# Patient Record
Sex: Male | Born: 1940 | Race: White | Hispanic: No | Marital: Married | Smoking: Never smoker
Health system: Southern US, Community
[De-identification: ages and names within clinical notes are randomized; demographics above are authoritative.]

## PROBLEM LIST (undated history)

## (undated) DIAGNOSIS — E7800 Pure hypercholesterolemia, unspecified: Secondary | ICD-10-CM

## (undated) DIAGNOSIS — J939 Pneumothorax, unspecified: Secondary | ICD-10-CM

## (undated) DIAGNOSIS — Z9889 Other specified postprocedural states: Secondary | ICD-10-CM

## (undated) DIAGNOSIS — I10 Essential (primary) hypertension: Secondary | ICD-10-CM

## (undated) DIAGNOSIS — K219 Gastro-esophageal reflux disease without esophagitis: Secondary | ICD-10-CM

## (undated) HISTORY — PX: COLONOSCOPY: SHX174

## (undated) HISTORY — PX: CORONARY ANGIOPLASTY WITH STENT PLACEMENT: SHX49

## (undated) HISTORY — PX: ESOPHAGOGASTRODUODENOSCOPY (EGD) WITH ESOPHAGEAL DILATION: SHX5812

## (undated) HISTORY — PX: CHEST TUBE INSERTION: SHX231

---

## 1999-08-27 ENCOUNTER — Ambulatory Visit (HOSPITAL_COMMUNITY): Admission: RE | Admit: 1999-08-27 | Discharge: 1999-08-27 | Payer: Self-pay | Admitting: *Deleted

## 2001-12-06 ENCOUNTER — Ambulatory Visit (HOSPITAL_COMMUNITY): Admission: RE | Admit: 2001-12-06 | Discharge: 2001-12-06 | Payer: Self-pay | Admitting: Family Medicine

## 2001-12-06 ENCOUNTER — Encounter: Payer: Self-pay | Admitting: Family Medicine

## 2001-12-08 ENCOUNTER — Ambulatory Visit (HOSPITAL_COMMUNITY): Admission: RE | Admit: 2001-12-08 | Discharge: 2001-12-08 | Payer: Self-pay | Admitting: Internal Medicine

## 2002-06-01 ENCOUNTER — Ambulatory Visit (HOSPITAL_COMMUNITY): Admission: RE | Admit: 2002-06-01 | Discharge: 2002-06-01 | Payer: Self-pay | Admitting: Internal Medicine

## 2002-09-07 ENCOUNTER — Encounter: Payer: Self-pay | Admitting: Family Medicine

## 2002-09-07 ENCOUNTER — Ambulatory Visit (HOSPITAL_COMMUNITY): Admission: RE | Admit: 2002-09-07 | Discharge: 2002-09-07 | Payer: Self-pay | Admitting: Family Medicine

## 2002-10-08 ENCOUNTER — Encounter (HOSPITAL_COMMUNITY): Admission: RE | Admit: 2002-10-08 | Discharge: 2002-11-07 | Payer: Self-pay | Admitting: *Deleted

## 2002-10-08 ENCOUNTER — Encounter: Payer: Self-pay | Admitting: *Deleted

## 2011-08-25 DIAGNOSIS — H04129 Dry eye syndrome of unspecified lacrimal gland: Secondary | ICD-10-CM | POA: Diagnosis not present

## 2011-08-25 DIAGNOSIS — H26499 Other secondary cataract, unspecified eye: Secondary | ICD-10-CM | POA: Diagnosis not present

## 2011-08-25 DIAGNOSIS — Z961 Presence of intraocular lens: Secondary | ICD-10-CM | POA: Diagnosis not present

## 2011-08-25 DIAGNOSIS — H538 Other visual disturbances: Secondary | ICD-10-CM | POA: Diagnosis not present

## 2011-10-26 DIAGNOSIS — Z79899 Other long term (current) drug therapy: Secondary | ICD-10-CM | POA: Diagnosis not present

## 2011-10-26 DIAGNOSIS — L2089 Other atopic dermatitis: Secondary | ICD-10-CM | POA: Diagnosis not present

## 2011-12-28 DIAGNOSIS — I10 Essential (primary) hypertension: Secondary | ICD-10-CM | POA: Diagnosis not present

## 2012-02-01 DIAGNOSIS — L2089 Other atopic dermatitis: Secondary | ICD-10-CM | POA: Diagnosis not present

## 2012-02-01 DIAGNOSIS — L570 Actinic keratosis: Secondary | ICD-10-CM | POA: Diagnosis not present

## 2012-02-01 DIAGNOSIS — Z79899 Other long term (current) drug therapy: Secondary | ICD-10-CM | POA: Diagnosis not present

## 2012-06-19 DIAGNOSIS — E785 Hyperlipidemia, unspecified: Secondary | ICD-10-CM | POA: Diagnosis not present

## 2012-06-19 DIAGNOSIS — Z79899 Other long term (current) drug therapy: Secondary | ICD-10-CM | POA: Diagnosis not present

## 2012-06-19 DIAGNOSIS — I10 Essential (primary) hypertension: Secondary | ICD-10-CM | POA: Diagnosis not present

## 2012-06-27 ENCOUNTER — Other Ambulatory Visit: Payer: Self-pay

## 2012-06-27 DIAGNOSIS — I10 Essential (primary) hypertension: Secondary | ICD-10-CM | POA: Diagnosis not present

## 2012-06-27 DIAGNOSIS — E785 Hyperlipidemia, unspecified: Secondary | ICD-10-CM | POA: Diagnosis not present

## 2012-06-27 DIAGNOSIS — Z139 Encounter for screening, unspecified: Secondary | ICD-10-CM

## 2012-06-27 DIAGNOSIS — Z23 Encounter for immunization: Secondary | ICD-10-CM | POA: Diagnosis not present

## 2012-07-04 ENCOUNTER — Telehealth: Payer: Self-pay

## 2012-07-04 NOTE — Telephone Encounter (Signed)
Pt came by the office on 06/27/2012 to schedule colonoscopy, ( His last one was 06/01/2002 by [facility]).  Dr. Ouida Sills is PCP now and I faxed over request for med list and Jewel sent it to me.   Gastroenterology Pre-Procedure Form  Request Date: 06/27/2012     Requesting Physician: Dr. Ouida Sills     PATIENT INFORMATION:  Randy Maddox is a 71 y.o., male (DOB=1940-12-12).  PROCEDURE: Procedure(s) requested: colonoscopy Procedure Reason: screening for colon cancer  PATIENT REVIEW QUESTIONS: The patient reports the following:   1. Diabetes Melitis: no 2. Joint replacements in the past 12 months: no 3. Major health problems in the past 3 months: no 4. Has an artificial valve or MVP:no 5. Has been advised in past to take antibiotics in advance of a procedure like teeth cleaning: no}    MEDICATIONS & ALLERGIES:    Patient reports the following regarding taking any blood thinners:   Plavix? no Aspirin?yes  Coumadin?  no  Patient confirms/reports the following medications:  Current Outpatient Prescriptions  Medication Sig Dispense Refill  . aspirin 81 MG tablet Take 81 mg by mouth daily.      . benazepril (LOTENSIN) 20 MG tablet Take 20 mg by mouth daily.      Marland Kitchen omeprazole (PRILOSEC) 40 MG capsule Take 40 mg by mouth daily.      . simvastatin (ZOCOR) 40 MG tablet Take 40 mg by mouth every evening.        Patient confirms/reports the following allergies:  Allergies  Allergen Reactions  . Darvocet (Propoxyphene-Acetaminophen)     vomiting  . Aleve (Naproxen Sodium) Diarrhea  . Vioxx (Rofecoxib) Other (See Comments)    HEADACHE    Patient is appropriate to schedule for requested procedure(s): yes  AUTHORIZATION INFORMATION Primary Insurance:  ID #:  Group #:  Pre-Cert / Auth required:  Pre-Cert / Auth #:   Secondary Insurance:   ID #:   Group #:  Pre-Cert / Auth required:  Pre-Cert / Auth #:   No orders of the defined types were placed in this encounter.    SCHEDULE  INFORMATION: Procedure has been scheduled as follows:  Date: 08/17/2012      Time: 10:30 AM  Location: [facility] Short Stay  This Gastroenterology Pre-Precedure Form is being routed to the following provider(s) for review: R. Roetta Sessions, MD

## 2012-07-04 NOTE — Telephone Encounter (Signed)
OK to schedule

## 2012-07-05 MED ORDER — PEG-KCL-NACL-NASULF-NA ASC-C 100 G PO SOLR: 1.0000 | ORAL | Status: DC

## 2012-07-05 NOTE — Telephone Encounter (Signed)
Rx sent to [facility] and Instructions mailed to pt.

## 2012-07-13 DIAGNOSIS — Z0000 Encounter for general adult medical examination without abnormal findings: Secondary | ICD-10-CM | POA: Diagnosis not present

## 2012-07-25 ENCOUNTER — Telehealth: Payer: Self-pay

## 2012-07-25 NOTE — Telephone Encounter (Signed)
Called pt to update triage. He has not had any new problems and no change in medications. He is scheduled for 08/17/2012.

## 2012-07-26 ENCOUNTER — Encounter (HOSPITAL_COMMUNITY): Payer: Self-pay | Admitting: Pharmacy Technician

## 2012-07-26 NOTE — Telephone Encounter (Signed)
OK to schedule

## 2012-08-11 ENCOUNTER — Telehealth: Payer: Self-pay

## 2012-08-11 NOTE — Telephone Encounter (Signed)
I called [employer] at [phone] and the recorder said that no PA's required for any of their plans.

## 2012-08-15 ENCOUNTER — Telehealth: Payer: Self-pay

## 2012-08-15 NOTE — Telephone Encounter (Signed)
Pt said he does not have any renal problems. He came by and picked up the prep and instructions.

## 2012-08-15 NOTE — Telephone Encounter (Signed)
Ok to use prepopik.

## 2012-08-15 NOTE — Telephone Encounter (Signed)
Pt called to check on [employer]. ( I had sent Rx to [facility] but he was in with his wife when she scheduled and she got a free sample and he requested one and so Soledad Gerlach and I told him we would try to get one before his procedure on 08/17/2012.) However, Charlie with [employer] has had a death in his family. Soledad Gerlach had e-mailed him and asked him to call and we have not heard from him. He is upset because we do not have one and said it was going to be a financial bind on him. He will have to put on a credit card. I told him I have a $20.00 coupon and he said he did not care anything about that , that will not help him now. I explained that we do not get lots of preps anyway, and I was sorry, offered to reschedule and he refused. Said if he doesn't get procedure this time, he will never do again. He said he had a good report last time and he felt like not doing it this time anyway. Then he said he was allergic to GO LYTELY, caused sick stomach and last time he got sick and they called in 2 pills and Mag Citriate for him. ( He said Dr. Jena Gauss told him that he was allergic to the same, GOLYTELY).   Please advise if this patient should have something other than the [employer].

## 2012-08-17 ENCOUNTER — Encounter (HOSPITAL_COMMUNITY): Payer: Self-pay | Admitting: *Deleted

## 2012-08-17 ENCOUNTER — Ambulatory Visit (HOSPITAL_COMMUNITY)
Admission: RE | Admit: 2012-08-17 | Discharge: 2012-08-17 | Disposition: A | Discharge status: Home / Self Care | Payer: Medicare Other | Source: Ambulatory Visit | Attending: Internal Medicine | Admitting: Internal Medicine

## 2012-08-17 ENCOUNTER — Encounter (HOSPITAL_COMMUNITY)
Admission: RE | Disposition: A | Discharge status: Home / Self Care | Payer: Self-pay | Source: Ambulatory Visit | Attending: Internal Medicine

## 2012-08-17 DIAGNOSIS — K5730 Diverticulosis of large intestine without perforation or abscess without bleeding: Secondary | ICD-10-CM | POA: Insufficient documentation

## 2012-08-17 DIAGNOSIS — Z139 Encounter for screening, unspecified: Secondary | ICD-10-CM

## 2012-08-17 DIAGNOSIS — Z1211 Encounter for screening for malignant neoplasm of colon: Secondary | ICD-10-CM | POA: Insufficient documentation

## 2012-08-17 DIAGNOSIS — I10 Essential (primary) hypertension: Secondary | ICD-10-CM | POA: Diagnosis not present

## 2012-08-17 HISTORY — PX: COLONOSCOPY: SHX5424

## 2012-08-17 HISTORY — DX: Gastro-esophageal reflux disease without esophagitis: K21.9

## 2012-08-17 HISTORY — DX: Essential (primary) hypertension: I10

## 2012-08-17 HISTORY — DX: Pure hypercholesterolemia, unspecified: E78.00

## 2012-08-17 SURGERY — COLONOSCOPY
Anesthesia: Moderate Sedation

## 2012-08-17 MED ORDER — ONDANSETRON HCL 4 MG/2ML IJ SOLN
INTRAMUSCULAR | Status: AC
  Filled 2012-08-17: qty 2

## 2012-08-17 MED ORDER — MIDAZOLAM HCL 5 MG/5ML IJ SOLN
INTRAMUSCULAR | Status: AC
  Filled 2012-08-17: qty 10

## 2012-08-17 MED ORDER — MEPERIDINE HCL 100 MG/ML IJ SOLN
INTRAMUSCULAR | Status: DC | PRN
  Administered 2012-08-17: 50 mg via INTRAVENOUS
  Administered 2012-08-17 (×2): 25 mg via INTRAVENOUS

## 2012-08-17 MED ORDER — SIMETHICONE 40 MG/0.6ML PO SUSP
ORAL | Status: DC | PRN
  Administered 2012-08-17: 11:00:00

## 2012-08-17 MED ORDER — MIDAZOLAM HCL 5 MG/5ML IJ SOLN
INTRAMUSCULAR | Status: DC | PRN
  Administered 2012-08-17: 2 mg via INTRAVENOUS
  Administered 2012-08-17: 1 mg via INTRAVENOUS
  Administered 2012-08-17: 2 mg via INTRAVENOUS

## 2012-08-17 MED ORDER — METOCLOPRAMIDE HCL 5 MG/ML IJ SOLN
INTRAMUSCULAR | Status: AC
  Filled 2012-08-17: qty 2

## 2012-08-17 MED ORDER — ONDANSETRON HCL 4 MG/2ML IJ SOLN
4.0000 mg | Freq: Once | INTRAMUSCULAR | Status: AC
  Administered 2012-08-17: 4 mg via INTRAVENOUS

## 2012-08-17 MED ORDER — METOCLOPRAMIDE HCL 5 MG/ML IJ SOLN
10.0000 mg | Freq: Once | INTRAMUSCULAR | Status: AC
  Administered 2012-08-17: 10 mg via INTRAVENOUS

## 2012-08-17 MED ORDER — MEPERIDINE HCL 100 MG/ML IJ SOLN
INTRAMUSCULAR | Status: DC
Start: 2012-08-17 — End: 2012-08-17
  Filled 2012-08-17: qty 2

## 2012-08-17 MED ORDER — METOCLOPRAMIDE HCL 5 MG/5ML PO SOLN: 10.0000 mg | Freq: Once | ORAL | Status: DC

## 2012-08-17 MED ORDER — SODIUM CHLORIDE 0.45 % IV SOLN
INTRAVENOUS | Status: DC
  Administered 2012-08-17: 10:00:00 via INTRAVENOUS

## 2012-08-17 NOTE — H&P (Signed)
Primary Care Physician:  Carylon Perches, MD Primary Gastroenterologist:  Dr. Jena Gauss  Pre-Procedure History & Physical: HPI:  Randy Maddox is a 72 y.o. male is here for a screening colonoscopy. No bowel symptoms. No family history of colon cancer or colon polyps. .  Negative colonoscopy 10 years ago.  Past Medical History  Diagnosis Date  . Hypertension   . Hypercholesteremia   . GERD (gastroesophageal reflux disease)     Past Surgical History  Procedure Date  . Esophagogastroduodenoscopy (egd) with esophageal dilation   . Colonoscopy   . Chest tube insertion     38 years ago due to spontaneous pneumothorax    Prior to Admission medications   Medication Sig Start Date End Date Taking? Authorizing Provider  benazepril (LOTENSIN) 20 MG tablet Take 20 mg by mouth daily.   Yes Historical Provider, MD  Multiple Vitamin (MULTIVITAMIN WITH MINERALS) TABS Take 1 tablet by mouth daily.   Yes Historical Provider, MD  omeprazole (PRILOSEC) 40 MG capsule Take 40 mg by mouth daily.   Yes Historical Provider, MD  peg 3350 powder (MOVIPREP) 100 G SOLR Take 1 kit (100 g total) by mouth as directed. 07/05/12  Yes Corbin Ade, MD  aspirin 81 MG tablet Take 81 mg by mouth daily.    Historical Provider, MD  simvastatin (ZOCOR) 40 MG tablet Take 40 mg by mouth every evening.    Historical Provider, MD    Allergies as of 06/27/2012  . (Not on File)    Family History  Problem Relation Age of Onset  . Colon cancer Neg Hx     History   Social History  . Marital Status: Married    Spouse Name: N/A    Number of Children: N/A  . Years of Education: N/A   Occupational History  . Not on file.   Social History Main Topics  . Smoking status: Never Smoker   . Smokeless tobacco: Not on file  . Alcohol Use: No  . Drug Use: No  . Sexually Active:    Other Topics Concern  . Not on file   Social History Narrative  . No narrative on file    Review of Systems: See HPI, otherwise negative  ROS  Physical Exam: BP 138/91  Pulse 85  Temp 97.8 F (36.6 C) (Oral)  Resp 24  Ht 5\' 11"  (1.803 m)  Wt 165 lb (74.844 kg)  BMI 23.01 kg/m2  SpO2 98% General:   Alert,  Well-developed, well-nourished, pleasant and cooperative in NAD Head:  Normocephalic and atraumatic. Eyes:  Sclera clear, no icterus.   Conjunctiva pink. Ears:  Normal auditory acuity. Nose:  No deformity, discharge,  or lesions. Mouth:  No deformity or lesions, dentition normal. Neck:  Supple; no masses or thyromegaly. Lungs:  Clear throughout to auscultation.   No wheezes, crackles, or rhonchi. No acute distress. Heart:  Regular rate and rhythm; no murmurs, clicks, rubs,  or gallops. Abdomen:  Soft, nontender and nondistended. No masses, hepatosplenomegaly or hernias noted. Normal bowel sounds, without guarding, and without rebound.   Msk:  Symmetrical without gross deformities. Normal posture. Pulses:  Normal pulses noted. Extremities:  Without clubbing or edema. Neurologic:  Alert and  oriented x4;  grossly normal neurologically. Skin:  Intact without significant lesions or rashes. Cervical Nodes:  No significant cervical adenopathy. Psych:  Alert and cooperative. Normal mood and affect.  Impression/Plan: Randy Maddox is now here to undergo a screening colonoscopy.  Average risk screening examination.  Risks,  benefits, limitations, imponderables and alternatives regarding colonoscopy have been reviewed with the patient. Questions have been answered. All parties agreeable.

## 2012-08-17 NOTE — OR Nursing (Addendum)
Pt with complaints of nausea.  Verbal order from Dr Jena Gauss obtained for zofran 4mg  IV now for nausea. Repeated back and affirmed.   Pt given the zofran.  When attempting to sit on side of bed became weak & once again complained of nausea.  Reclined pt back in bed; applied cold cloth to face & back.  Blood pressure 109/56.  Fluids continuing to be infused.  Pt has received 650cc 0.45 NS to this point.                Pt up to chair.  Pt's wife stated pt vomiting.  Pt with approximately 100cc liquid emesis in basin.  Dr. Jena Gauss made aware.  Verbal order for zofran 4mg  IV now by Dr Jena Gauss for nausea.    After getting dressed, pt with vomiting. Approximately 50cc.  Dr Jena Gauss made aware.  Verbal order for 10mg  IV Reglan one dose now.  Also verbal order to restart IV. Read back and affirmed order.

## 2012-08-17 NOTE — Op Note (Signed)
[facility] [address] [place], [address]   COLONOSCOPY PROCEDURE REPORT  PATIENT: Randy Maddox, Randy Maddox  MR#:         [id number] BIRTHDATE: 09/02/1940 , 71  yrs. old GENDER: Male ENDOSCOPIST: R.  Roetta Sessions, MD FACP FACG REFERRED BY:  Carylon Perches, M.D. PROCEDURE DATE:  08/17/2012 PROCEDURE:     Screening colonoscopy  INDICATIONS: Average risk screening examination  INFORMED CONSENT:  The risks, benefits, alternatives and imponderables including but not limited to bleeding, perforation as well as the possibility of a missed lesion have been reviewed.  The potential for biopsy, lesion removal, etc. have also been discussed.  Questions have been answered.  All parties agreeable. Please see the history and physical in the medical record for more information.  MEDICATIONS: Versed 5 mg IV and Demerol milligrams IV in divided doses.  DESCRIPTION OF PROCEDURE:  After a digital rectal exam was performed, the Pentax Colonoscope [id number]  colonoscope was advanced from the anus through the rectum and colon to the area of the cecum, ileocecal valve and appendiceal orifice.  The cecum was deeply intubated.  These structures were well-seen and photographed for the record.  From the level of the cecum and ileocecal valve, the scope was slowly and cautiously withdrawn.  The mucosal surfaces were carefully surveyed utilizing scope tip deflection to facilitate fold flattening as needed.  The scope was pulled down into the rectum where a thorough examination including retroflexion was performed.    FINDINGS:  Adequate preparation. Normal rectum. Scattered left-sided diverticula; the remainder of the colonic mucosa appeared normal.  THERAPEUTIC / DIAGNOSTIC MANEUVERS PERFORMED:  None  COMPLICATIONS: None  CECAL WITHDRAWAL TIME:  8 minutes  IMPRESSION:  Colonic diverticulosis  RECOMMENDATIONS: Consider one more screening colonoscopy in 10 years if overall health  permits   _______________________________ eSigned:  R. Roetta Sessions, MD FACP [facility] 08/17/2012 11:33 AM   CC:

## 2012-08-21 ENCOUNTER — Telehealth: Payer: Self-pay | Admitting: Internal Medicine

## 2012-08-21 NOTE — Telephone Encounter (Signed)
Spoke with pt- he has not had a bm since his procedure. He stated he feels like he needs to go and he goes to the bathroom and has nothing but gas. Pt is not having any pain, no N/V now, no dizziness, no fever, no more problems with his blood pressure. He has not tried to take anything yet. He does have a bottle of colace 100mg  at home and would like to try it tonight. Advised pt to try one tonight and drink plenty of fluids and to call me back if he continues to have problems. Also advised pt- he should get some otc miralax to have on hand for when he did become constipated. Pt verbalized understanding and agreed with plan. He said if he didn't have a bm tomorrow he was going to call back.

## 2012-08-21 NOTE — Telephone Encounter (Signed)
Pt had tcs on January 9 and still has not had a BM and wanted to know was this normal. Also, he mentioned that while at hospital his BP dropped twice. Saturday he said that he was so nauseated he had to stay in bed. He said that he was able to pass gas and his urine was fine, but had concerns about not having a BM and wondered if he should take a stool softener. Please advise and call patient at [phone]

## 2012-08-22 ENCOUNTER — Encounter (HOSPITAL_COMMUNITY): Payer: Self-pay | Admitting: Internal Medicine

## 2012-08-22 NOTE — Telephone Encounter (Signed)
Agree 

## 2012-09-14 DIAGNOSIS — Q828 Other specified congenital malformations of skin: Secondary | ICD-10-CM | POA: Diagnosis not present

## 2012-09-19 DIAGNOSIS — C44320 Squamous cell carcinoma of skin of unspecified parts of face: Secondary | ICD-10-CM | POA: Diagnosis not present

## 2012-09-19 DIAGNOSIS — L259 Unspecified contact dermatitis, unspecified cause: Secondary | ICD-10-CM | POA: Diagnosis not present

## 2012-10-06 DIAGNOSIS — L2089 Other atopic dermatitis: Secondary | ICD-10-CM | POA: Diagnosis not present

## 2012-10-06 DIAGNOSIS — C44320 Squamous cell carcinoma of skin of unspecified parts of face: Secondary | ICD-10-CM | POA: Diagnosis not present

## 2012-10-26 DIAGNOSIS — C44320 Squamous cell carcinoma of skin of unspecified parts of face: Secondary | ICD-10-CM | POA: Diagnosis not present

## 2012-11-02 DIAGNOSIS — Z4802 Encounter for removal of sutures: Secondary | ICD-10-CM | POA: Diagnosis not present

## 2012-12-14 DIAGNOSIS — Z483 Aftercare following surgery for neoplasm: Secondary | ICD-10-CM | POA: Diagnosis not present

## 2012-12-25 DIAGNOSIS — I10 Essential (primary) hypertension: Secondary | ICD-10-CM | POA: Diagnosis not present

## 2013-04-03 DIAGNOSIS — L2089 Other atopic dermatitis: Secondary | ICD-10-CM | POA: Diagnosis not present

## 2013-04-03 DIAGNOSIS — Z85828 Personal history of other malignant neoplasm of skin: Secondary | ICD-10-CM | POA: Diagnosis not present

## 2013-06-12 DIAGNOSIS — H2640 Unspecified secondary cataract: Secondary | ICD-10-CM | POA: Diagnosis not present

## 2013-06-12 DIAGNOSIS — H5000 Unspecified esotropia: Secondary | ICD-10-CM | POA: Diagnosis not present

## 2013-06-12 DIAGNOSIS — H179 Unspecified corneal scar and opacity: Secondary | ICD-10-CM | POA: Diagnosis not present

## 2013-06-12 DIAGNOSIS — H532 Diplopia: Secondary | ICD-10-CM | POA: Diagnosis not present

## 2013-06-22 DIAGNOSIS — I10 Essential (primary) hypertension: Secondary | ICD-10-CM | POA: Diagnosis not present

## 2013-06-22 DIAGNOSIS — E785 Hyperlipidemia, unspecified: Secondary | ICD-10-CM | POA: Diagnosis not present

## 2013-06-22 DIAGNOSIS — Z79899 Other long term (current) drug therapy: Secondary | ICD-10-CM | POA: Diagnosis not present

## 2013-06-28 DIAGNOSIS — Z23 Encounter for immunization: Secondary | ICD-10-CM | POA: Diagnosis not present

## 2013-06-28 DIAGNOSIS — I10 Essential (primary) hypertension: Secondary | ICD-10-CM | POA: Diagnosis not present

## 2013-06-28 DIAGNOSIS — E785 Hyperlipidemia, unspecified: Secondary | ICD-10-CM | POA: Diagnosis not present

## 2013-08-21 DIAGNOSIS — H18599 Other hereditary corneal dystrophies, unspecified eye: Secondary | ICD-10-CM | POA: Diagnosis not present

## 2013-08-21 DIAGNOSIS — H5000 Unspecified esotropia: Secondary | ICD-10-CM | POA: Diagnosis not present

## 2013-08-21 DIAGNOSIS — H01009 Unspecified blepharitis unspecified eye, unspecified eyelid: Secondary | ICD-10-CM | POA: Diagnosis not present

## 2013-08-21 DIAGNOSIS — H532 Diplopia: Secondary | ICD-10-CM | POA: Diagnosis not present

## 2013-12-24 DIAGNOSIS — I10 Essential (primary) hypertension: Secondary | ICD-10-CM | POA: Diagnosis not present

## 2014-04-03 DIAGNOSIS — L2089 Other atopic dermatitis: Secondary | ICD-10-CM | POA: Diagnosis not present

## 2014-04-03 DIAGNOSIS — L820 Inflamed seborrheic keratosis: Secondary | ICD-10-CM | POA: Diagnosis not present

## 2014-04-03 DIAGNOSIS — Z8589 Personal history of malignant neoplasm of other organs and systems: Secondary | ICD-10-CM | POA: Diagnosis not present

## 2014-04-03 DIAGNOSIS — L570 Actinic keratosis: Secondary | ICD-10-CM | POA: Diagnosis not present

## 2014-05-04 DIAGNOSIS — A0839 Other viral enteritis: Secondary | ICD-10-CM | POA: Diagnosis not present

## 2014-05-06 DIAGNOSIS — R197 Diarrhea, unspecified: Secondary | ICD-10-CM | POA: Diagnosis not present

## 2014-05-06 DIAGNOSIS — E860 Dehydration: Secondary | ICD-10-CM | POA: Diagnosis not present

## 2014-05-06 DIAGNOSIS — N289 Disorder of kidney and ureter, unspecified: Secondary | ICD-10-CM | POA: Diagnosis not present

## 2014-05-06 DIAGNOSIS — N10 Acute tubulo-interstitial nephritis: Secondary | ICD-10-CM | POA: Diagnosis not present

## 2014-05-15 DIAGNOSIS — Z79899 Other long term (current) drug therapy: Secondary | ICD-10-CM | POA: Diagnosis not present

## 2014-06-07 DIAGNOSIS — Z85828 Personal history of other malignant neoplasm of skin: Secondary | ICD-10-CM | POA: Diagnosis not present

## 2014-06-07 DIAGNOSIS — I781 Nevus, non-neoplastic: Secondary | ICD-10-CM | POA: Diagnosis not present

## 2014-06-07 DIAGNOSIS — L708 Other acne: Secondary | ICD-10-CM | POA: Diagnosis not present

## 2014-06-07 DIAGNOSIS — L2089 Other atopic dermatitis: Secondary | ICD-10-CM | POA: Diagnosis not present

## 2014-06-18 DIAGNOSIS — Z79899 Other long term (current) drug therapy: Secondary | ICD-10-CM | POA: Diagnosis not present

## 2014-06-18 DIAGNOSIS — I10 Essential (primary) hypertension: Secondary | ICD-10-CM | POA: Diagnosis not present

## 2014-06-18 DIAGNOSIS — E785 Hyperlipidemia, unspecified: Secondary | ICD-10-CM | POA: Diagnosis not present

## 2014-06-25 DIAGNOSIS — E785 Hyperlipidemia, unspecified: Secondary | ICD-10-CM | POA: Diagnosis not present

## 2014-06-25 DIAGNOSIS — I2510 Atherosclerotic heart disease of native coronary artery without angina pectoris: Secondary | ICD-10-CM | POA: Diagnosis not present

## 2014-06-25 DIAGNOSIS — Z23 Encounter for immunization: Secondary | ICD-10-CM | POA: Diagnosis not present

## 2014-06-25 DIAGNOSIS — N390 Urinary tract infection, site not specified: Secondary | ICD-10-CM | POA: Diagnosis not present

## 2014-08-13 ENCOUNTER — Inpatient Hospital Stay (HOSPITAL_COMMUNITY)
Admission: EM | Admit: 2014-08-13 | Discharge: 2014-08-15 | DRG: 195 | Disposition: A | Discharge status: Home / Self Care | Payer: Medicare Other | Attending: Internal Medicine | Admitting: Internal Medicine

## 2014-08-13 ENCOUNTER — Emergency Department (HOSPITAL_COMMUNITY): Payer: Medicare Other

## 2014-08-13 ENCOUNTER — Encounter (HOSPITAL_COMMUNITY): Payer: Self-pay | Admitting: Emergency Medicine

## 2014-08-13 DIAGNOSIS — R630 Anorexia: Secondary | ICD-10-CM | POA: Diagnosis present

## 2014-08-13 DIAGNOSIS — E780 Pure hypercholesterolemia: Secondary | ICD-10-CM | POA: Diagnosis present

## 2014-08-13 DIAGNOSIS — K219 Gastro-esophageal reflux disease without esophagitis: Secondary | ICD-10-CM | POA: Diagnosis present

## 2014-08-13 DIAGNOSIS — Z888 Allergy status to other drugs, medicaments and biological substances status: Secondary | ICD-10-CM | POA: Diagnosis not present

## 2014-08-13 DIAGNOSIS — I10 Essential (primary) hypertension: Secondary | ICD-10-CM | POA: Diagnosis present

## 2014-08-13 DIAGNOSIS — Z6823 Body mass index (BMI) 23.0-23.9, adult: Secondary | ICD-10-CM | POA: Diagnosis not present

## 2014-08-13 DIAGNOSIS — R079 Chest pain, unspecified: Secondary | ICD-10-CM

## 2014-08-13 DIAGNOSIS — Z7982 Long term (current) use of aspirin: Secondary | ICD-10-CM

## 2014-08-13 DIAGNOSIS — J181 Lobar pneumonia, unspecified organism: Secondary | ICD-10-CM | POA: Diagnosis not present

## 2014-08-13 DIAGNOSIS — I2510 Atherosclerotic heart disease of native coronary artery without angina pectoris: Secondary | ICD-10-CM | POA: Diagnosis present

## 2014-08-13 DIAGNOSIS — R0602 Shortness of breath: Secondary | ICD-10-CM | POA: Diagnosis not present

## 2014-08-13 DIAGNOSIS — J189 Pneumonia, unspecified organism: Secondary | ICD-10-CM | POA: Diagnosis not present

## 2014-08-13 DIAGNOSIS — K449 Diaphragmatic hernia without obstruction or gangrene: Secondary | ICD-10-CM | POA: Diagnosis not present

## 2014-08-13 DIAGNOSIS — R05 Cough: Secondary | ICD-10-CM | POA: Diagnosis not present

## 2014-08-13 DIAGNOSIS — R531 Weakness: Secondary | ICD-10-CM | POA: Diagnosis not present

## 2014-08-13 HISTORY — DX: Pneumothorax, unspecified: J93.9

## 2014-08-13 LAB — COMPREHENSIVE METABOLIC PANEL
ALT: 16 U/L (ref 0–53)
ANION GAP: 8 (ref 5–15)
AST: 16 U/L (ref 0–37)
Albumin: 4 g/dL (ref 3.5–5.2)
Alkaline Phosphatase: 44 U/L (ref 39–117)
BUN: 15 mg/dL (ref 6–23)
CALCIUM: 9.3 mg/dL (ref 8.4–10.5)
CO2: 26 mmol/L (ref 19–32)
CREATININE: 1.25 mg/dL (ref 0.50–1.35)
Chloride: 102 mEq/L (ref 96–112)
GFR, EST AFRICAN AMERICAN: 65 mL/min — AB (ref >90)
GFR, EST NON AFRICAN AMERICAN: 56 mL/min — AB (ref >90)
GLUCOSE: 112 mg/dL — AB (ref 70–99)
Potassium: 4.2 mmol/L (ref 3.5–5.1)
Sodium: 136 mmol/L (ref 135–145)
TOTAL PROTEIN: 7.9 g/dL (ref 6.0–8.3)
Total Bilirubin: 0.7 mg/dL (ref 0.3–1.2)

## 2014-08-13 LAB — CBC
HCT: 39.8 % (ref 39.0–52.0)
Hemoglobin: 13.3 g/dL (ref 13.0–17.0)
MCH: 31.1 pg (ref 26.0–34.0)
MCHC: 33.4 g/dL (ref 30.0–36.0)
MCV: 93.2 fL (ref 78.0–100.0)
PLATELETS: 160 10*3/uL (ref 150–400)
RBC: 4.27 MIL/uL (ref 4.22–5.81)
RDW: 12.9 % (ref 11.5–15.5)
WBC: 11.2 10*3/uL — ABNORMAL HIGH (ref 4.0–10.5)

## 2014-08-13 LAB — PROTIME-INR
INR: 1.25 (ref 0.00–1.49)
Prothrombin Time: 15.8 seconds — ABNORMAL HIGH (ref 11.6–15.2)

## 2014-08-13 LAB — D-DIMER, QUANTITATIVE (NOT AT ARMC): D DIMER QUANT: 2.1 ug{FEU}/mL — AB (ref 0.00–0.48)

## 2014-08-13 LAB — APTT: APTT: 32 s (ref 24–37)

## 2014-08-13 LAB — TROPONIN I

## 2014-08-13 MED ORDER — IOHEXOL 350 MG/ML SOLN
100.0000 mL | Freq: Once | INTRAVENOUS | Status: AC | PRN
  Administered 2014-08-13: 100 mL via INTRAVENOUS

## 2014-08-13 MED ORDER — SODIUM CHLORIDE 0.9 % IV SOLN
1000.0000 mL | INTRAVENOUS | Status: DC
Start: 2014-08-13 — End: 2014-08-15
  Administered 2014-08-13: 1000 mL via INTRAVENOUS

## 2014-08-13 MED ORDER — PANTOPRAZOLE SODIUM 40 MG PO TBEC
80.0000 mg | DELAYED_RELEASE_TABLET | Freq: Every day | ORAL | Status: DC
  Administered 2014-08-14 – 2014-08-15 (×2): 80 mg via ORAL
  Filled 2014-08-13 (×2): qty 2

## 2014-08-13 MED ORDER — ASPIRIN EC 81 MG PO TBEC
81.0000 mg | DELAYED_RELEASE_TABLET | Freq: Every day | ORAL | Status: DC
  Administered 2014-08-14 – 2014-08-15 (×2): 81 mg via ORAL
  Filled 2014-08-13 (×2): qty 1

## 2014-08-13 MED ORDER — CEFTRIAXONE SODIUM 1 G IJ SOLR: 1.0000 g | INTRAMUSCULAR | Status: DC

## 2014-08-13 MED ORDER — SODIUM CHLORIDE 0.9 % IV SOLN: INTRAVENOUS | Status: DC

## 2014-08-13 MED ORDER — HYDROMORPHONE HCL 1 MG/ML IJ SOLN: 0.5000 mg | INTRAMUSCULAR | Status: AC | PRN

## 2014-08-13 MED ORDER — ALBUTEROL SULFATE (2.5 MG/3ML) 0.083% IN NEBU: 2.5000 mg | INHALATION_SOLUTION | Freq: Four times a day (QID) | RESPIRATORY_TRACT | Status: AC | PRN

## 2014-08-13 MED ORDER — DEXTROSE 5 % IV SOLN
500.0000 mg | INTRAVENOUS | Status: DC
  Administered 2014-08-14: 500 mg via INTRAVENOUS
  Filled 2014-08-13 (×2): qty 500

## 2014-08-13 MED ORDER — SIMVASTATIN 20 MG PO TABS
40.0000 mg | ORAL_TABLET | Freq: Every evening | ORAL | Status: DC
  Administered 2014-08-13 – 2014-08-14 (×2): 40 mg via ORAL
  Filled 2014-08-13 (×2): qty 2

## 2014-08-13 MED ORDER — ACETAMINOPHEN 500 MG PO TABS
1000.0000 mg | ORAL_TABLET | Freq: Every day | ORAL | Status: DC
  Administered 2014-08-13 – 2014-08-14 (×2): 1000 mg via ORAL
  Filled 2014-08-13 (×3): qty 2

## 2014-08-13 MED ORDER — ASPIRIN EC 81 MG PO TBEC: 81.0000 mg | DELAYED_RELEASE_TABLET | Freq: Every day | ORAL | Status: DC

## 2014-08-13 MED ORDER — ADULT MULTIVITAMIN W/MINERALS CH
1.0000 | ORAL_TABLET | Freq: Every day | ORAL | Status: DC
  Administered 2014-08-14 – 2014-08-15 (×2): 1 via ORAL
  Filled 2014-08-13 (×2): qty 1

## 2014-08-13 MED ORDER — HEPARIN SODIUM (PORCINE) 5000 UNIT/ML IJ SOLN
5000.0000 [IU] | Freq: Three times a day (TID) | INTRAMUSCULAR | Status: DC
  Administered 2014-08-13 – 2014-08-15 (×6): 5000 [IU] via SUBCUTANEOUS
  Filled 2014-08-13 (×5): qty 1

## 2014-08-13 MED ORDER — OMEGA-3-ACID ETHYL ESTERS 1 G PO CAPS
1.0000 g | ORAL_CAPSULE | Freq: Every day | ORAL | Status: DC
  Administered 2014-08-14 – 2014-08-15 (×2): 1 g via ORAL
  Filled 2014-08-13 (×4): qty 1

## 2014-08-13 MED ORDER — DEXTROSE 5 % IV SOLN
1.0000 g | Freq: Once | INTRAVENOUS | Status: AC
  Administered 2014-08-13: 1 g via INTRAVENOUS
  Filled 2014-08-13: qty 10

## 2014-08-13 MED ORDER — HYDROMORPHONE HCL 1 MG/ML IJ SOLN: 1.0000 mg | INTRAMUSCULAR | Status: DC | PRN

## 2014-08-13 MED ORDER — BENAZEPRIL HCL 10 MG PO TABS
20.0000 mg | ORAL_TABLET | Freq: Every day | ORAL | Status: DC
  Administered 2014-08-14 – 2014-08-15 (×2): 20 mg via ORAL
  Filled 2014-08-13 (×2): qty 1
  Filled 2014-08-13 (×2): qty 2

## 2014-08-13 MED ORDER — DEXTROSE 5 % IV SOLN: 500.0000 mg | INTRAVENOUS | Status: DC

## 2014-08-13 MED ORDER — AZITHROMYCIN 250 MG PO TABS
500.0000 mg | ORAL_TABLET | Freq: Once | ORAL | Status: AC
  Administered 2014-08-13: 500 mg via ORAL
  Filled 2014-08-13: qty 2

## 2014-08-13 MED ORDER — ADULT MULTIVITAMIN W/MINERALS CH: 1.0000 | ORAL_TABLET | Freq: Every day | ORAL | Status: DC

## 2014-08-13 MED ORDER — PANTOPRAZOLE SODIUM 40 MG PO TBEC: 40.0000 mg | DELAYED_RELEASE_TABLET | Freq: Every day | ORAL | Status: DC

## 2014-08-13 MED ORDER — TRIAMCINOLONE ACETONIDE 0.1 % EX CREA
1.0000 "application " | TOPICAL_CREAM | Freq: Every day | CUTANEOUS | Status: DC | PRN
  Filled 2014-08-13: qty 15

## 2014-08-13 MED ORDER — CEFTRIAXONE SODIUM IN DEXTROSE 20 MG/ML IV SOLN
1.0000 g | INTRAVENOUS | Status: DC
  Administered 2014-08-14: 1 g via INTRAVENOUS
  Filled 2014-08-13 (×2): qty 50

## 2014-08-13 MED ORDER — MORPHINE SULFATE 4 MG/ML IJ SOLN
4.0000 mg | Freq: Once | INTRAMUSCULAR | Status: AC
  Administered 2014-08-13: 4 mg via INTRAVENOUS
  Filled 2014-08-13: qty 1

## 2014-08-13 NOTE — H&P (Signed)
[facility] History and Physical  Randy Maddox FTD:[id number] DOB: 05/04/1941 DOA: 08/13/2014  Referring physician: ER PCP: Asencion Noble, MD   Chief Complaint: Left-sided chest pain, cough  HPI: Randy Maddox is a 74 y.o. male  This is a 74 year old man, nonsmoker, who presents with one-week history of being unwell with an intermittent cough which resolved but then came back but with a 2 day history of left-sided pleuritic chest pain. He has had no fever associated with this. He has been feeling weak and anorexic with very poor by mouth intake. He also has been noticing that he is becoming dyspneic on fairly minimal exertion. He denies any hemoptysis. There are no palpitations. Evaluation in the emergency room shows him to have pneumonia, mainly on the left side but also an infiltrate is seen on the right side on CT scan. He is now being admitted for further management.   Review of Systems:  Apart from symptoms above, all systems negative.  Past Medical History  Diagnosis Date  . Hypertension   . Hypercholesteremia   . GERD (gastroesophageal reflux disease)   . Pneumothorax     left   Past Surgical History  Procedure Laterality Date  . Esophagogastroduodenoscopy (egd) with esophageal dilation    . Colonoscopy    . Chest tube insertion      38 years ago due to spontaneous pneumothorax  . Colonoscopy  08/17/2012    Procedure: COLONOSCOPY;  Surgeon: Daneil Dolin, MD;  Location: [facility];  Service: Endoscopy;  Laterality: N/A;  10:30 AM-moved to 10:45 Darius Bump notified pt  . Coronary angioplasty with stent placement     Social History:  reports that he has never smoked. He has never used smokeless tobacco. He reports that he does not drink alcohol or use illicit drugs.  Allergies  Allergen Reactions  . Darvocet [Propoxyphene N-Acetaminophen]     vomiting  . Aleve [Naproxen Sodium] Diarrhea  . Vioxx [Rofecoxib] Other (See Comments)    HEADACHE  . Actifed Cold-Allergy  [Chlorpheniramine-Phenyleph Er]     "makes me hyper'  . Meperidine And Related Nausea And Vomiting    Decrease in BP levels  . Sudafed [Pseudoephedrine Hcl]     "Makes me Hyper"    Family History  Problem Relation Age of Onset  . Colon cancer Neg Hx      Prior to Admission medications   Medication Sig Start Date End Date Taking? Authorizing Provider  acetaminophen (TYLENOL) 500 MG tablet Take 1,000 mg by mouth at bedtime.   Yes Historical Provider, MD  aspirin EC 81 MG tablet Take 81 mg by mouth daily.   Yes Historical Provider, MD  benazepril (LOTENSIN) 20 MG tablet Take 20 mg by mouth daily.   Yes Historical Provider, MD  Multiple Vitamin (MULTIVITAMIN WITH MINERALS) TABS Take 1 tablet by mouth daily.   Yes Historical Provider, MD  Omega-3 Fatty Acids (FISH OIL PO) Take 1 capsule by mouth daily.   Yes Historical Provider, MD  omeprazole (PRILOSEC) 40 MG capsule Take 40 mg by mouth daily.   Yes Historical Provider, MD  simvastatin (ZOCOR) 40 MG tablet Take 40 mg by mouth every evening.   Yes Historical Provider, MD  triamcinolone cream (KENALOG) 0.1 % Apply 1 application topically daily as needed. For itching/irritation 06/11/14  Yes Historical Provider, MD  peg 3350 powder (MOVIPREP) 100 G SOLR Take 1 kit (100 g total) by mouth as directed. Patient not taking: Reported on 08/13/2014 07/05/12   Herbie Baltimore  Hilton Cork, MD   Physical Exam: Filed Vitals:   08/13/14 1900 08/13/14 1930 08/13/14 2000 08/13/14 2030  BP: 146/84 150/78 143/95 141/80  Pulse: 85 87 81 77  Temp:      TempSrc:      Resp: $Remo'26 19 20 17  'YEOps$ Height:      Weight:      SpO2: 90% 96% 96% 93%    Wt Readings from Last 3 Encounters:  08/13/14 74.844 kg (165 lb)  08/17/12 74.844 kg (165 lb)    General:  Appears calm and comfortable. He does not look toxic or septic. There is no increased work of breathing at rest and he is not requiring any supplemental oxygen. There is no peripheral central cyanosis. Eyes: PERRL, normal  lids, irises & conjunctiva ENT: grossly normal hearing, lips & tongue Neck: no LAD, masses or thyromegaly Cardiovascular: RRR, no m/r/g. No LE edema. Telemetry: SR, no arrhythmias  Respiratory: Reduced air entry in the left mid and lower zones. There is no bronchial breathing, crackles or wheezing. Abdomen: soft, ntnd Skin: no rash or induration seen on limited exam Musculoskeletal: grossly normal tone BUE/BLE Psychiatric: grossly normal mood and affect, speech fluent and appropriate Neurologic: grossly non-focal.          Labs on Admission:  Basic Metabolic Panel:  Recent Labs Lab 08/13/14 1652  NA 136  K 4.2  CL 102  CO2 26  GLUCOSE 112*  BUN 15  CREATININE 1.25  CALCIUM 9.3   Liver Function Tests:  Recent Labs Lab 08/13/14 1652  AST 16  ALT 16  ALKPHOS 44  BILITOT 0.7  PROT 7.9  ALBUMIN 4.0   No results for input(s): LIPASE, AMYLASE in the last 168 hours. No results for input(s): AMMONIA in the last 168 hours. CBC:  Recent Labs Lab 08/13/14 1652  WBC 11.2*  HGB 13.3  HCT 39.8  MCV 93.2  PLT 160   Cardiac Enzymes:  Recent Labs Lab 08/13/14 1652  TROPONINI <0.03    BNP (last 3 results) No results for input(s): PROBNP in the last 8760 hours. CBG: No results for input(s): GLUCAP in the last 168 hours.  Radiological Exams on Admission: Dg Chest 2 View  08/13/2014   CLINICAL DATA:  left side chest pain that started yesterday. Patient reports shortness of breath and weakness. Per patient worse with movement. Reports occasional coughing. Denies any fever. Patient has had pneumothorax on left side and has a cardiac hx. Per patient took $RemoveBefo'81mg'QVabxiEmOdP$  aspirin this morning.  EXAM: CHEST - 2 VIEW  COMPARISON:  None available  FINDINGS: Airspace opacities in basilar segments left lower lobe. Probable small left effusion. Right lung clear. Heart size upper limits normal for technique. No pneumothorax. Visualized skeletal structures are unremarkable.  IMPRESSION: 1. Left  lower lobe basilar airspace disease with adjacent effusion, possibly pneumonia.   Electronically Signed   By: Arne Cleveland M.D.   On: 08/13/2014 18:28   Ct Angio Chest Pe W/cm &/or Wo Cm  08/13/2014   CLINICAL DATA:  Chest pain since yesterday. Cough and shortness of Breath.  EXAM: CT ANGIOGRAPHY CHEST WITH CONTRAST  TECHNIQUE: Multidetector CT imaging of the chest was performed using the standard protocol during bolus administration of intravenous contrast. Multiplanar CT image reconstructions and MIPs were obtained to evaluate the vascular anatomy.  CONTRAST:  173mL OMNIPAQUE IOHEXOL 350 MG/ML SOLN  COMPARISON:  None.  FINDINGS: Chest wall: No chest wall mass, supraclavicular or axillary adenopathy. The thyroid gland appears normal. The  bony thorax is intact. No destructive bone lesions or spinal canal compromise. Moderate degenerative changes involving the thoracic spine.  Mediastinum: The heart is normal in size. No pericardial effusion. There are scattered mediastinal and hilar lymph nodes but no mass or adenopathy the aorta is normal in caliber. No dissection. The pulmonary arterial tree is fairly well opacified. No definite filling defects to suggest pulmonary emboli. The esophagus is unremarkable except for a large hiatal hernia.  Lungs: There are bibasilar infiltrates, left greater than right with a left-sided parapneumonic effusion. No worrisome mass lesions. No obvious endobronchial lesion.  Upper abdomen: Diffuse fatty infiltration of the liver. Left renal cyst and left hepatic lobe cyst.  Review of the MIP images confirms the above findings.  IMPRESSION: 1. No definite CT findings for pulmonary embolism. 2. Bilateral lower lobe infiltrates, left greater than right with a left-sided parapneumonic effusion. 3. Large hiatal hernia. 4. Normal thoracic aorta.   Electronically Signed   By: Kalman Jewels M.D.   On: 08/13/2014 19:45      Assessment/Plan   1. Community-acquired pneumonia, mainly  affecting the left side with a parapneumonic effusion but also infiltrate seen on the right. This will be treated with intravenous antibiotics and analgesics as required for the pleuritic chest pain. 2. Anorexia. This should improve as his pneumonia gets treated and he'll be given IV fluids in the meantime to maintain hydration. 3. Hypertension, stable.  Further recommendations will depend on patient's hospital progress.   Code Status: Full code  DVT Prophylaxis: Heparin.  Family Communication: I discussed the plan with the patient at the bedside.   Disposition Plan: Home when medically stable.   Time spent: 60 minutes.  Doree Albee [facility] Pager [phone].

## 2014-08-13 NOTE — ED Notes (Signed)
Patient c/o left side chest pain that started yesterday. Patient reports shortness of breath and weakness. Per patient worse with movement. Reports occasional coughing. Denies any fever. Patient has had pneumothorax on left side and has a cardiac hx. Per patient took 81mg  aspirin this morning.

## 2014-08-13 NOTE — ED Provider Notes (Addendum)
CSN: [id number]     Arrival date & time 08/13/14  1607 History   First MD Initiated Contact with Patient 08/13/14 1611     Chief Complaint  Patient presents with  . Chest Pain   Patient is a 74 y.o. male presenting with chest pain. The history is provided by the patient.  Chest Pain Pain location:  L chest Pain quality: aching and sharp   Pain quality comment:  It is a constant aching pain since yesterday with intermittent sharp pain whenever he moves Pain radiates to:  Does not radiate Pain severity:  Severe Duration:  1 day Timing:  Constant Progression:  Unchanged Chronicity:  New Worsened by:  Movement and deep breathing Associated symptoms: cough, fatigue and shortness of breath   Associated symptoms: no abdominal pain, no fever, no nausea, not vomiting and no weakness   Risk factors: coronary artery disease   This pain does not feel like prior anginal pain.  He also has history of PTX many years ago.  Pain is  No better today so he came to the ED.  Past Medical History  Diagnosis Date  . Hypertension   . Hypercholesteremia   . GERD (gastroesophageal reflux disease)   . Pneumothorax     left   Past Surgical History  Procedure Laterality Date  . Esophagogastroduodenoscopy (egd) with esophageal dilation    . Colonoscopy    . Chest tube insertion      38 years ago due to spontaneous pneumothorax  . Colonoscopy  08/17/2012    Procedure: COLONOSCOPY;  Surgeon: Daneil Dolin, MD;  Location: [facility];  Service: Endoscopy;  Laterality: N/A;  10:30 AM-moved to 10:45 Darius Bump notified pt  . Coronary angioplasty with stent placement     Family History  Problem Relation Age of Onset  . Colon cancer Neg Hx    History  Substance Use Topics  . Smoking status: Never Smoker   . Smokeless tobacco: Never Used  . Alcohol Use: No    Review of Systems  Constitutional: Positive for fatigue. Negative for fever.  Respiratory: Positive for cough and shortness of breath.    Cardiovascular: Positive for chest pain.  Gastrointestinal: Negative for nausea, vomiting and abdominal pain.  Neurological: Negative for weakness.  All other systems reviewed and are negative.     Allergies  Darvocet; Aleve; Vioxx; Actifed cold-allergy; Meperidine and related; and Sudafed  Home Medications   Prior to Admission medications   Medication Sig Start Date End Date Taking? Authorizing Provider  acetaminophen (TYLENOL) 500 MG tablet Take 1,000 mg by mouth at bedtime.   Yes Historical Provider, MD  aspirin EC 81 MG tablet Take 81 mg by mouth daily.   Yes Historical Provider, MD  benazepril (LOTENSIN) 20 MG tablet Take 20 mg by mouth daily.   Yes Historical Provider, MD  Multiple Vitamin (MULTIVITAMIN WITH MINERALS) TABS Take 1 tablet by mouth daily.   Yes Historical Provider, MD  Omega-3 Fatty Acids (FISH OIL PO) Take 1 capsule by mouth daily.   Yes Historical Provider, MD  omeprazole (PRILOSEC) 40 MG capsule Take 40 mg by mouth daily.   Yes Historical Provider, MD  simvastatin (ZOCOR) 40 MG tablet Take 40 mg by mouth every evening.   Yes Historical Provider, MD  triamcinolone cream (KENALOG) 0.1 % Apply 1 application topically daily as needed. For itching/irritation 06/11/14  Yes Historical Provider, MD  peg 3350 powder (MOVIPREP) 100 G SOLR Take 1 kit (100 g total) by mouth  as directed. Patient not taking: Reported on 08/13/2014 07/05/12   Daneil Dolin, MD   BP 148/79 mmHg  Pulse 88  Temp(Src) 97.8 F (36.6 C) (Oral)  Resp 24  Ht $R'5\' 11"'EO$  (1.803 m)  Wt 165 lb (74.844 kg)  BMI 23.02 kg/m2  SpO2 93% Physical Exam  Constitutional: He appears well-developed and well-nourished. No distress.  HENT:  Head: Normocephalic and atraumatic.  Right Ear: External ear normal.  Left Ear: External ear normal.  Mouth/Throat: No oropharyngeal exudate.  Eyes: Conjunctivae are normal. Right eye exhibits no discharge. Left eye exhibits no discharge. No scleral icterus.  Neck: Neck  supple. No tracheal deviation present.  Cardiovascular: Normal rate, regular rhythm and intact distal pulses.   Pulmonary/Chest: Effort normal and breath sounds normal. No stridor. No respiratory distress. He has no wheezes. He has no rales. He exhibits no tenderness.  Abdominal: Soft. Bowel sounds are normal. He exhibits no distension. There is no tenderness. There is no rebound and no guarding.  Musculoskeletal: He exhibits no edema or tenderness.  Neurological: He is alert. He has normal strength. No cranial nerve deficit (no facial droop, extraocular movements intact, no slurred speech) or sensory deficit. He exhibits normal muscle tone. He displays no seizure activity. Coordination normal.  Skin: Skin is warm and dry. No rash noted.  Psychiatric: He has a normal mood and affect.  Nursing note and vitals reviewed.   ED Course  Procedures (including critical care time) Labs Review Labs Reviewed  CBC - Abnormal; Notable for the following:    WBC 11.2 (*)    All other components within normal limits  COMPREHENSIVE METABOLIC PANEL - Abnormal; Notable for the following:    Glucose, Bld 112 (*)    GFR calc non Af Amer 56 (*)    GFR calc Af Amer 65 (*)    All other components within normal limits  PROTIME-INR - Abnormal; Notable for the following:    Prothrombin Time 15.8 (*)    All other components within normal limits  D-DIMER, QUANTITATIVE - Abnormal; Notable for the following:    D-Dimer, Quant 2.10 (*)    All other components within normal limits  APTT  TROPONIN I    Imaging Review Dg Chest 2 View  08/13/2014   CLINICAL DATA:  left side chest pain that started yesterday. Patient reports shortness of breath and weakness. Per patient worse with movement. Reports occasional coughing. Denies any fever. Patient has had pneumothorax on left side and has a cardiac hx. Per patient took $RemoveBefo'81mg'fmfGOzqMpFO$  aspirin this morning.  EXAM: CHEST - 2 VIEW  COMPARISON:  None available  FINDINGS: Airspace  opacities in basilar segments left lower lobe. Probable small left effusion. Right lung clear. Heart size upper limits normal for technique. No pneumothorax. Visualized skeletal structures are unremarkable.  IMPRESSION: 1. Left lower lobe basilar airspace disease with adjacent effusion, possibly pneumonia.   Electronically Signed   By: Arne Cleveland M.D.   On: 08/13/2014 18:28     EKG Interpretation   Date/Time:  Tuesday August 13 2014 16:20:39 EST Ventricular Rate:  97 PR Interval:  136 QRS Duration: 94 QT Interval:  343 QTC Calculation: 436 R Axis:   46 Text Interpretation:  Sinus rhythm Consider left ventricular hypertrophy  Baseline wander in lead(s) V2 No old tracing to compare Confirmed by Sally-Anne Wamble   MD-J, Navada Osterhout ([id number]) on 08/13/2014 4:26:29 PM      MDM   Final diagnoses:  Chest pain  CAP (community acquired pneumonia)  Probable CAP causing his chest pain.  Elevated d dimer.  Will check CT scan to rule out PE.  Port score 82.  Will consult with hospitalist regarding admission considering his port score, and degree of discomfort.    Dorie Rank, MD 08/13/14 Valerie Roys  Dorie Rank, MD 08/13/14 [phone]

## 2014-08-14 DIAGNOSIS — J181 Lobar pneumonia, unspecified organism: Secondary | ICD-10-CM | POA: Diagnosis not present

## 2014-08-14 LAB — COMPREHENSIVE METABOLIC PANEL
ALT: 12 U/L (ref 0–53)
AST: 14 U/L (ref 0–37)
Albumin: 3.3 g/dL — ABNORMAL LOW (ref 3.5–5.2)
Alkaline Phosphatase: 39 U/L (ref 39–117)
Anion gap: 7 (ref 5–15)
BILIRUBIN TOTAL: 0.6 mg/dL (ref 0.3–1.2)
BUN: 13 mg/dL (ref 6–23)
CALCIUM: 8.5 mg/dL (ref 8.4–10.5)
CHLORIDE: 105 meq/L (ref 96–112)
CO2: 26 mmol/L (ref 19–32)
CREATININE: 1.16 mg/dL (ref 0.50–1.35)
GFR calc Af Amer: 71 mL/min — ABNORMAL LOW (ref >90)
GFR calc non Af Amer: 61 mL/min — ABNORMAL LOW (ref >90)
Glucose, Bld: 113 mg/dL — ABNORMAL HIGH (ref 70–99)
POTASSIUM: 4.2 mmol/L (ref 3.5–5.1)
Sodium: 138 mmol/L (ref 135–145)
Total Protein: 6.9 g/dL (ref 6.0–8.3)

## 2014-08-14 LAB — CBC
HCT: 36.1 % — ABNORMAL LOW (ref 39.0–52.0)
HEMOGLOBIN: 11.9 g/dL — AB (ref 13.0–17.0)
MCH: 31.1 pg (ref 26.0–34.0)
MCHC: 33 g/dL (ref 30.0–36.0)
MCV: 94.3 fL (ref 78.0–100.0)
PLATELETS: 160 10*3/uL (ref 150–400)
RBC: 3.83 MIL/uL — AB (ref 4.22–5.81)
RDW: 13.1 % (ref 11.5–15.5)
WBC: 8.1 10*3/uL (ref 4.0–10.5)

## 2014-08-14 LAB — STREP PNEUMONIAE URINARY ANTIGEN: STREP PNEUMO URINARY ANTIGEN: NEGATIVE

## 2014-08-14 NOTE — Progress Notes (Signed)
Subjective: Randy Maddox presented to the emergency room last night with left chest pain. He was found to have pneumonia. His chest x-ray revealed a left lower lobe infiltrate. His d-dimer was elevated at 2.1. He underwent a CT scan of the chest which revealed bilateral lower lobe infiltrates consistent with pneumonia. His white count was 11.2 and is down to 8.1 this morning. He denies any fever. He has been symptomatic for 2 days. He has had very limited coughing and no purulent sputum production.  Objective: Vital signs in last 24 hours: Filed Vitals:   08/13/14 2030 08/13/14 2100 08/13/14 2128 08/14/14 0621  BP: 141/80 150/82 162/81 143/75  Pulse: 77 83 82 71  Temp:   98.4 F (36.9 C) 98.8 F (37.1 C)  TempSrc:   Oral Oral  Resp: 17 21 20 14   Height:      Weight:   165 lb 5.5 oz (75 kg)   SpO2: 93% 96% 98% 97%   Weight change:   Intake/Output Summary (Last 24 hours) at 08/14/14 1055 Last data filed at 08/14/14 8588  Gross per 24 hour  Intake      0 ml  Output    350 ml  Net   -350 ml    Physical Exam: Alert. No distress. HEENT: Nose and oropharynx unremarkable. Lungs reveal left basilar rales. No wheezes. Heart regular with no murmurs. Extremities reveal no edema.    Lab Results:    Results for orders placed or performed during the hospital encounter of 08/13/14 (from the past 24 hour(s))  APTT     Status: None   Collection Time: 08/13/14  4:52 PM  Result Value Ref Range   aPTT 32 24 - 37 seconds  CBC     Status: Abnormal   Collection Time: 08/13/14  4:52 PM  Result Value Ref Range   WBC 11.2 (H) 4.0 - 10.5 K/uL   RBC 4.27 4.22 - 5.81 MIL/uL   Hemoglobin 13.3 13.0 - 17.0 g/dL   HCT 39.8 39.0 - 52.0 %   MCV 93.2 78.0 - 100.0 fL   MCH 31.1 26.0 - 34.0 pg   MCHC 33.4 30.0 - 36.0 g/dL   RDW 12.9 11.5 - 15.5 %   Platelets 160 150 - 400 K/uL  Comprehensive metabolic panel     Status: Abnormal   Collection Time: 08/13/14  4:52 PM  Result Value Ref Range   Sodium 136  135 - 145 mmol/L   Potassium 4.2 3.5 - 5.1 mmol/L   Chloride 102 96 - 112 mEq/L   CO2 26 19 - 32 mmol/L   Glucose, Bld 112 (H) 70 - 99 mg/dL   BUN 15 6 - 23 mg/dL   Creatinine, Ser 1.25 0.50 - 1.35 mg/dL   Calcium 9.3 8.4 - 10.5 mg/dL   Total Protein 7.9 6.0 - 8.3 g/dL   Albumin 4.0 3.5 - 5.2 g/dL   AST 16 0 - 37 U/L   ALT 16 0 - 53 U/L   Alkaline Phosphatase 44 39 - 117 U/L   Total Bilirubin 0.7 0.3 - 1.2 mg/dL   GFR calc non Af Amer 56 (L) >90 mL/min   GFR calc Af Amer 65 (L) >90 mL/min   Anion gap 8 5 - 15  Protime-INR     Status: Abnormal   Collection Time: 08/13/14  4:52 PM  Result Value Ref Range   Prothrombin Time 15.8 (H) 11.6 - 15.2 seconds   INR 1.25 0.00 - 1.49  Troponin  I (order at [facility])     Status: None   Collection Time: 08/13/14  4:52 PM  Result Value Ref Range   Troponin I <0.03 <0.031 ng/mL  D-dimer, quantitative     Status: Abnormal   Collection Time: 08/13/14  4:52 PM  Result Value Ref Range   D-Dimer, Quant 2.10 (H) 0.00 - 0.48 ug/mL-FEU  Culture, blood (routine x 2) Call MD if unable to obtain prior to antibiotics being given     Status: None (Preliminary result)   Collection Time: 08/13/14  9:35 PM  Result Value Ref Range   Specimen Description BLOOD LEFT ARM    Special Requests BOTTLES DRAWN AEROBIC ONLY 10CC    Culture NO GROWTH 1 DAY    Report Status PENDING   Culture, blood (routine x 2) Call MD if unable to obtain prior to antibiotics being given     Status: None (Preliminary result)   Collection Time: 08/13/14  9:40 PM  Result Value Ref Range   Specimen Description BLOOD LEFT HAND    Special Requests BOTTLES DRAWN AEROBIC ONLY 10CC    Culture NO GROWTH 1 DAY    Report Status PENDING   Comprehensive metabolic panel     Status: Abnormal   Collection Time: 08/14/14  6:24 AM  Result Value Ref Range   Sodium 138 135 - 145 mmol/L   Potassium 4.2 3.5 - 5.1 mmol/L   Chloride 105 96 - 112 mEq/L   CO2 26 19 - 32 mmol/L   Glucose, Bld 113 (H) 70 - 99  mg/dL   BUN 13 6 - 23 mg/dL   Creatinine, Ser 1.16 0.50 - 1.35 mg/dL   Calcium 8.5 8.4 - 10.5 mg/dL   Total Protein 6.9 6.0 - 8.3 g/dL   Albumin 3.3 (L) 3.5 - 5.2 g/dL   AST 14 0 - 37 U/L   ALT 12 0 - 53 U/L   Alkaline Phosphatase 39 39 - 117 U/L   Total Bilirubin 0.6 0.3 - 1.2 mg/dL   GFR calc non Af Amer 61 (L) >90 mL/min   GFR calc Af Amer 71 (L) >90 mL/min   Anion gap 7 5 - 15  CBC     Status: Abnormal   Collection Time: 08/14/14  6:24 AM  Result Value Ref Range   WBC 8.1 4.0 - 10.5 K/uL   RBC 3.83 (L) 4.22 - 5.81 MIL/uL   Hemoglobin 11.9 (L) 13.0 - 17.0 g/dL   HCT 36.1 (L) 39.0 - 52.0 %   MCV 94.3 78.0 - 100.0 fL   MCH 31.1 26.0 - 34.0 pg   MCHC 33.0 30.0 - 36.0 g/dL   RDW 13.1 11.5 - 15.5 %   Platelets 160 150 - 400 K/uL     ABGS No results for input(s): PHART, PO2ART, TCO2, HCO3 in the last 72 hours.  Invalid input(s): PCO2 CULTURES Recent Results (from the past 240 hour(s))  Culture, blood (routine x 2) Call MD if unable to obtain prior to antibiotics being given     Status: None (Preliminary result)   Collection Time: 08/13/14  9:35 PM  Result Value Ref Range Status   Specimen Description BLOOD LEFT ARM  Final   Special Requests BOTTLES DRAWN AEROBIC ONLY 10CC  Final   Culture NO GROWTH 1 DAY  Final   Report Status PENDING  Incomplete  Culture, blood (routine x 2) Call MD if unable to obtain prior to antibiotics being given     Status: None (Preliminary result)  Collection Time: 08/13/14  9:40 PM  Result Value Ref Range Status   Specimen Description BLOOD LEFT HAND  Final   Special Requests BOTTLES DRAWN AEROBIC ONLY 10CC  Final   Culture NO GROWTH 1 DAY  Final   Report Status PENDING  Incomplete   Studies/Results: Dg Chest 2 View  08/13/2014   CLINICAL DATA:  left side chest pain that started yesterday. Patient reports shortness of breath and weakness. Per patient worse with movement. Reports occasional coughing. Denies any fever. Patient has had  pneumothorax on left side and has a cardiac hx. Per patient took 81mg  aspirin this morning.  EXAM: CHEST - 2 VIEW  COMPARISON:  None available  FINDINGS: Airspace opacities in basilar segments left lower lobe. Probable small left effusion. Right lung clear. Heart size upper limits normal for technique. No pneumothorax. Visualized skeletal structures are unremarkable.  IMPRESSION: 1. Left lower lobe basilar airspace disease with adjacent effusion, possibly pneumonia.   Electronically Signed   By: Arne Cleveland M.D.   On: 08/13/2014 18:28   Ct Angio Chest Pe W/cm &/or Wo Cm  08/13/2014   CLINICAL DATA:  Chest pain since yesterday. Cough and shortness of Breath.  EXAM: CT ANGIOGRAPHY CHEST WITH CONTRAST  TECHNIQUE: Multidetector CT imaging of the chest was performed using the standard protocol during bolus administration of intravenous contrast. Multiplanar CT image reconstructions and MIPs were obtained to evaluate the vascular anatomy.  CONTRAST:  158mL OMNIPAQUE IOHEXOL 350 MG/ML SOLN  COMPARISON:  None.  FINDINGS: Chest wall: No chest wall mass, supraclavicular or axillary adenopathy. The thyroid gland appears normal. The bony thorax is intact. No destructive bone lesions or spinal canal compromise. Moderate degenerative changes involving the thoracic spine.  Mediastinum: The heart is normal in size. No pericardial effusion. There are scattered mediastinal and hilar lymph nodes but no mass or adenopathy the aorta is normal in caliber. No dissection. The pulmonary arterial tree is fairly well opacified. No definite filling defects to suggest pulmonary emboli. The esophagus is unremarkable except for a large hiatal hernia.  Lungs: There are bibasilar infiltrates, left greater than right with a left-sided parapneumonic effusion. No worrisome mass lesions. No obvious endobronchial lesion.  Upper abdomen: Diffuse fatty infiltration of the liver. Left renal cyst and left hepatic lobe cyst.  Review of the MIP images  confirms the above findings.  IMPRESSION: 1. No definite CT findings for pulmonary embolism. 2. Bilateral lower lobe infiltrates, left greater than right with a left-sided parapneumonic effusion. 3. Large hiatal hernia. 4. Normal thoracic aorta.   Electronically Signed   By: Kalman Jewels M.D.   On: 08/13/2014 19:45   Micro Results: Recent Results (from the past 240 hour(s))  Culture, blood (routine x 2) Call MD if unable to obtain prior to antibiotics being given     Status: None (Preliminary result)   Collection Time: 08/13/14  9:35 PM  Result Value Ref Range Status   Specimen Description BLOOD LEFT ARM  Final   Special Requests BOTTLES DRAWN AEROBIC ONLY 10CC  Final   Culture NO GROWTH 1 DAY  Final   Report Status PENDING  Incomplete  Culture, blood (routine x 2) Call MD if unable to obtain prior to antibiotics being given     Status: None (Preliminary result)   Collection Time: 08/13/14  9:40 PM  Result Value Ref Range Status   Specimen Description BLOOD LEFT HAND  Final   Special Requests BOTTLES DRAWN AEROBIC ONLY 10CC  Final   Culture  NO GROWTH 1 DAY  Final   Report Status PENDING  Incomplete   Studies/Results: Dg Chest 2 View  08/13/2014   CLINICAL DATA:  left side chest pain that started yesterday. Patient reports shortness of breath and weakness. Per patient worse with movement. Reports occasional coughing. Denies any fever. Patient has had pneumothorax on left side and has a cardiac hx. Per patient took 81mg  aspirin this morning.  EXAM: CHEST - 2 VIEW  COMPARISON:  None available  FINDINGS: Airspace opacities in basilar segments left lower lobe. Probable small left effusion. Right lung clear. Heart size upper limits normal for technique. No pneumothorax. Visualized skeletal structures are unremarkable.  IMPRESSION: 1. Left lower lobe basilar airspace disease with adjacent effusion, possibly pneumonia.   Electronically Signed   By: Arne Cleveland M.D.   On: 08/13/2014 18:28   Ct  Angio Chest Pe W/cm &/or Wo Cm  08/13/2014   CLINICAL DATA:  Chest pain since yesterday. Cough and shortness of Breath.  EXAM: CT ANGIOGRAPHY CHEST WITH CONTRAST  TECHNIQUE: Multidetector CT imaging of the chest was performed using the standard protocol during bolus administration of intravenous contrast. Multiplanar CT image reconstructions and MIPs were obtained to evaluate the vascular anatomy.  CONTRAST:  189mL OMNIPAQUE IOHEXOL 350 MG/ML SOLN  COMPARISON:  None.  FINDINGS: Chest wall: No chest wall mass, supraclavicular or axillary adenopathy. The thyroid gland appears normal. The bony thorax is intact. No destructive bone lesions or spinal canal compromise. Moderate degenerative changes involving the thoracic spine.  Mediastinum: The heart is normal in size. No pericardial effusion. There are scattered mediastinal and hilar lymph nodes but no mass or adenopathy the aorta is normal in caliber. No dissection. The pulmonary arterial tree is fairly well opacified. No definite filling defects to suggest pulmonary emboli. The esophagus is unremarkable except for a large hiatal hernia.  Lungs: There are bibasilar infiltrates, left greater than right with a left-sided parapneumonic effusion. No worrisome mass lesions. No obvious endobronchial lesion.  Upper abdomen: Diffuse fatty infiltration of the liver. Left renal cyst and left hepatic lobe cyst.  Review of the MIP images confirms the above findings.  IMPRESSION: 1. No definite CT findings for pulmonary embolism. 2. Bilateral lower lobe infiltrates, left greater than right with a left-sided parapneumonic effusion. 3. Large hiatal hernia. 4. Normal thoracic aorta.   Electronically Signed   By: Kalman Jewels M.D.   On: 08/13/2014 19:45   Medications:  I have reviewed the patient's current medications Scheduled Meds: . acetaminophen  1,000 mg Oral QHS  . aspirin EC  81 mg Oral Daily  . azithromycin  500 mg Intravenous Q24H  . benazepril  20 mg Oral Daily  .  cefTRIAXone (ROCEPHIN)  IV  1 g Intravenous Q24H  . heparin  5,000 Units Subcutaneous 3 times per day  . multivitamin with minerals  1 tablet Oral Daily  . omega-3 acid ethyl esters  1 g Oral Daily  . pantoprazole  80 mg Oral Daily  . simvastatin  40 mg Oral QPM   Continuous Infusions: . sodium chloride 1,000 mL (08/13/14 1703)  . sodium chloride     PRN Meds:.triamcinolone cream   Assessment/Plan: #1. Community acquired pneumonia. Continue ceftriaxone and azithromycin. Blood cultures are pending. #2. Hypertension. Continue benazepril. #3. Nonobstructive CAD. Troponin negative. Active Problems:   CAP (community acquired pneumonia)   Hypertension   Community acquired pneumonia     LOS: 1 day   Ceirra Belli 08/14/2014, 10:55 AM

## 2014-08-14 NOTE — Progress Notes (Signed)
UR chart review completed.  

## 2014-08-14 NOTE — Care Management Note (Addendum)
    Page 1 of 1   08/15/2014     10:08:35 AM CARE MANAGEMENT NOTE 08/15/2014  Patient:  Randy Maddox, Randy Maddox   Account Number:  [id number]  Date Initiated:  08/14/2014  Documentation initiated by:  Theophilus Kinds  Subjective/Objective Assessment:   Pt admitted from home with pneumonia. Pt lives with his wife and will return home at discharge. Pt is independent with ADL's.     Action/Plan:   No CM needs noted.   Anticipated DC Date:  08/15/2014   Anticipated DC Plan:  [place]  CM consult      Choice offered to / List presented to:             Status of service:  Completed, signed off Medicare Important Message given?  NA - LOS <3 / Initial given by admissions (If response is "NO", the following Medicare IM given date fields will be blank) Date Medicare IM given:   Medicare IM given by:   Date Additional Medicare IM given:   Additional Medicare IM given by:    Discharge Disposition:  HOME/SELF CARE  Per UR Regulation:    If discussed at Long Length of Stay Meetings, dates discussed:    Comments:  08/15/14 [place], RN BSN CM Pt discharged home today. No CM needs noted.  08/14/14 [place], RN BSN CM

## 2014-08-14 NOTE — Progress Notes (Signed)
   08/14/14 1100  Mobility  Activity Ambulate in hall  Level of Assistance Independent  Assistive Device None  Distance Ambulated (ft) 500 ft  Ambulation Response Tolerated well  Patient maintained O2 sat of 94% on room air while walking.

## 2014-08-15 DIAGNOSIS — J181 Lobar pneumonia, unspecified organism: Secondary | ICD-10-CM | POA: Diagnosis not present

## 2014-08-15 LAB — HIV ANTIBODY (ROUTINE TESTING W REFLEX)
HIV 1/O/2 Abs-Index Value: <1 (ref <1.00)
HIV-1/HIV-2 Ab: NONREACTIVE

## 2014-08-15 LAB — LEGIONELLA ANTIGEN, URINE

## 2014-08-15 MED ORDER — AZITHROMYCIN 250 MG PO TABS: ORAL_TABLET | ORAL | Status: DC

## 2014-08-15 MED ORDER — CEFUROXIME AXETIL 500 MG PO TABS: 500.0000 mg | ORAL_TABLET | Freq: Two times a day (BID) | ORAL | Status: DC

## 2014-08-15 NOTE — Progress Notes (Signed)
NURSING PROGRESS NOTE  Randy Maddox [id number] Discharge Data: 08/15/2014 4:31 PM Attending Provider: No att. providers found RSW:NIOEV,OJJ, MD   Aurelio Jew to be D/C'd Home per MD order.    All IV's discontinued and monitored for bleeding.  All belongings returned to patient for patient to take home.  AVS summary and prescriptions reviewed with patient  Patient left floor via wheelchair, escorted by NT.  Last Documented Vital Signs:  Blood pressure 134/83, pulse 76, temperature 98.6 F (37 C), temperature source Oral, resp. rate 18, height 5\' 11"  (1.803 m), weight 75 kg (165 lb 5.5 oz), SpO2 95 %.  Cecilie Kicks D

## 2014-08-15 NOTE — Discharge Summary (Signed)
Physician Discharge Summary  Randy Maddox:[id number] DOB: 01-16-1941 DOA: 08/13/2014   Admit date: 08/13/2014 Discharge date: 08/15/2014  Discharge Diagnoses:  Active Problems:   CAP (community acquired pneumonia)   Hypertension   Community acquired pneumonia    Wt Readings from Last 3 Encounters:  08/13/14 165 lb 5.5 oz (75 kg)  08/17/12 165 lb (74.844 kg)     Hospital Course:  This patient is a 74 year old male who presented to the hospital with left chest pain. His chest x-ray revealed a left lower lobe infiltrate consistent with pneumonia. His white count was 11.2. He was oxygenating well. He had an elevated d-dimer 2.1 and underwent a CT scan of the chest which revealed bilateral lower lobe infiltrates consistent with pneumonia. There is no evidence of pulmonary embolus. His white count normalized. His symptoms resolved. He was much improved and stable for discharge on the morning of January 7. His discharge exam revealed minimal basilar rales on exam with a normal heart rate in the 70s. He was treated with ceftriaxone and azithromycin. Blood cultures have thus far been negative. He will be transitioned to oral outpatient antibiotics therapy with cefuroxime and azithromycin. He will be seen in follow-up in my office in one week.   Discharge Instructions     Medication List    STOP taking these medications        acetaminophen 500 MG tablet  Commonly known as:  TYLENOL     FISH OIL PO     peg 3350 powder 100 G Solr  Commonly known as:  MOVIPREP      TAKE these medications        aspirin EC 81 MG tablet  Take 81 mg by mouth daily.     azithromycin 250 MG tablet  Commonly known as:  ZITHROMAX  One a day for 3 days     benazepril 20 MG tablet  Commonly known as:  LOTENSIN  Take 20 mg by mouth daily.     cefUROXime 500 MG tablet  Commonly known as:  CEFTIN  Take 1 tablet (500 mg total) by mouth 2 (two) times daily with a meal.     multivitamin with minerals  Tabs tablet  Take 1 tablet by mouth daily.     omeprazole 40 MG capsule  Commonly known as:  PRILOSEC  Take 40 mg by mouth daily.     simvastatin 40 MG tablet  Commonly known as:  ZOCOR  Take 40 mg by mouth every evening.     triamcinolone cream 0.1 %  Commonly known as:  KENALOG  Apply 1 application topically daily as needed. For itching/irritation         [name] 08/15/2014

## 2014-08-15 NOTE — Plan of Care (Signed)
Problem: Phase II Progression Outcomes Goal: Review culture results with MD if needed Outcome: Progressing Blood cultures pending.

## 2014-08-19 LAB — CULTURE, BLOOD (ROUTINE X 2)
CULTURE: NO GROWTH
CULTURE: NO GROWTH

## 2014-08-26 DIAGNOSIS — J189 Pneumonia, unspecified organism: Secondary | ICD-10-CM | POA: Diagnosis not present

## 2014-12-27 DIAGNOSIS — Z23 Encounter for immunization: Secondary | ICD-10-CM | POA: Diagnosis not present

## 2014-12-27 DIAGNOSIS — I10 Essential (primary) hypertension: Secondary | ICD-10-CM | POA: Diagnosis not present

## 2014-12-30 DIAGNOSIS — H26491 Other secondary cataract, right eye: Secondary | ICD-10-CM | POA: Diagnosis not present

## 2014-12-30 DIAGNOSIS — H1859 Other hereditary corneal dystrophies: Secondary | ICD-10-CM | POA: Diagnosis not present

## 2014-12-30 DIAGNOSIS — H5000 Unspecified esotropia: Secondary | ICD-10-CM | POA: Diagnosis not present

## 2014-12-30 DIAGNOSIS — H43813 Vitreous degeneration, bilateral: Secondary | ICD-10-CM | POA: Diagnosis not present

## 2015-01-08 DIAGNOSIS — H26491 Other secondary cataract, right eye: Secondary | ICD-10-CM | POA: Diagnosis not present

## 2015-01-08 DIAGNOSIS — H2640 Unspecified secondary cataract: Secondary | ICD-10-CM | POA: Diagnosis not present

## 2015-04-21 DIAGNOSIS — Z6827 Body mass index (BMI) 27.0-27.9, adult: Secondary | ICD-10-CM | POA: Diagnosis not present

## 2015-04-21 DIAGNOSIS — J019 Acute sinusitis, unspecified: Secondary | ICD-10-CM | POA: Diagnosis not present

## 2015-06-13 DIAGNOSIS — D1801 Hemangioma of skin and subcutaneous tissue: Secondary | ICD-10-CM | POA: Diagnosis not present

## 2015-06-13 DIAGNOSIS — L820 Inflamed seborrheic keratosis: Secondary | ICD-10-CM | POA: Diagnosis not present

## 2015-06-13 DIAGNOSIS — I781 Nevus, non-neoplastic: Secondary | ICD-10-CM | POA: Diagnosis not present

## 2015-06-13 DIAGNOSIS — L2089 Other atopic dermatitis: Secondary | ICD-10-CM | POA: Diagnosis not present

## 2015-06-13 DIAGNOSIS — L918 Other hypertrophic disorders of the skin: Secondary | ICD-10-CM | POA: Diagnosis not present

## 2015-06-13 DIAGNOSIS — L821 Other seborrheic keratosis: Secondary | ICD-10-CM | POA: Diagnosis not present

## 2015-06-13 DIAGNOSIS — Z85828 Personal history of other malignant neoplasm of skin: Secondary | ICD-10-CM | POA: Diagnosis not present

## 2015-06-13 IMAGING — CT CT ANGIO CHEST
1 of 6 series · 5 of 36 positions shown · IV contrast (Omnipaque 300)
Comparison: None.

CLINICAL DATA: Chest pain since yesterday. Cough and shortness of
Breath.

EXAM:
CT ANGIOGRAPHY CHEST WITH CONTRAST
TECHNIQUE: Multidetector CT imaging of the chest was performed using the
standard protocol during bolus administration of intravenous
contrast. Multiplanar CT image reconstructions and MIPs were
obtained to evaluate the vascular anatomy.
CONTRAST:  100mL OMNIPAQUE IOHEXOL 350 MG/ML SOLN

[Series 11: pe 3.0 b40f · axial · 0.63mm/px · z∈[-238,-82]mm · 5 of 79 slices shown]
[im 14/79  lung]
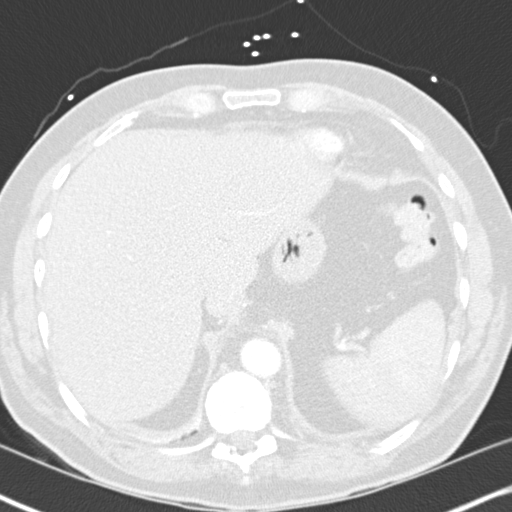
[im 27/79  mediastinal]
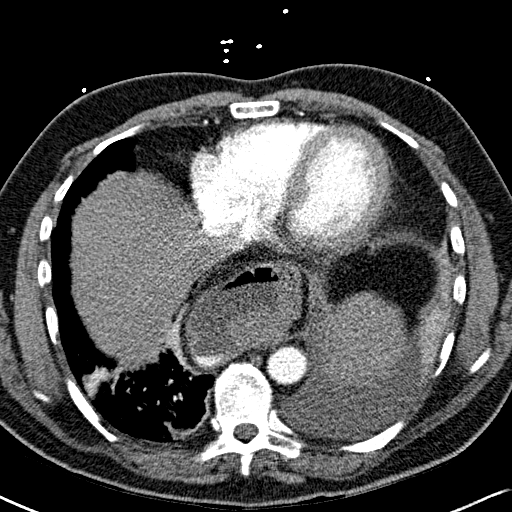
[im 40/79  lung]
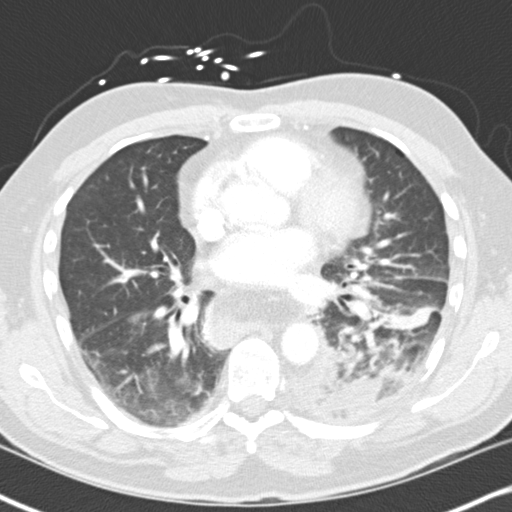
[im 53/79  mediastinal]
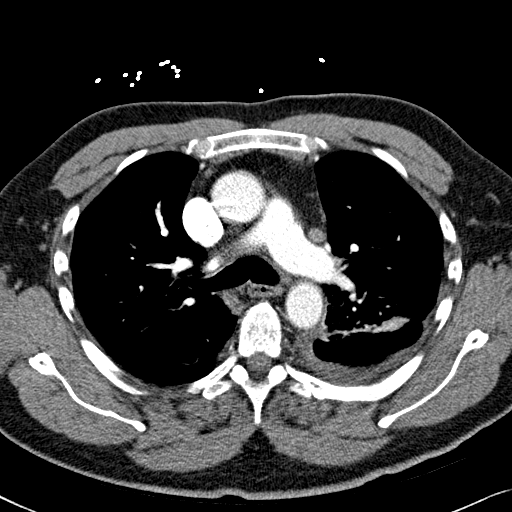
[im 66/79  lung]
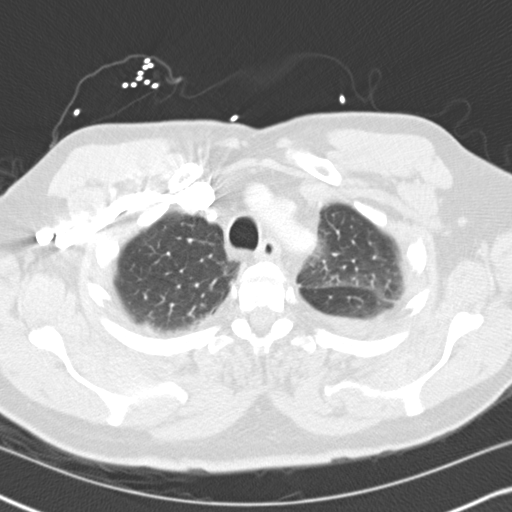

[5 of 36 positions shown; findings below may reference images not displayed]

FINDINGS: Chest wall: No chest wall mass, supraclavicular or axillary
adenopathy. The thyroid gland appears normal. The bony thorax is
intact. No destructive bone lesions or spinal canal compromise.
Moderate degenerative changes involving the thoracic spine.

Mediastinum: The heart is normal in size. No pericardial effusion.
There are scattered mediastinal and hilar lymph nodes but no mass or
adenopathy the aorta is normal in caliber. No dissection. The
pulmonary arterial tree is fairly well opacified. No definite
filling defects to suggest pulmonary emboli. The esophagus is
unremarkable except for a large hiatal hernia.

Lungs: There are bibasilar infiltrates, left greater than right with
a left-sided parapneumonic effusion. No worrisome mass lesions. No
obvious endobronchial lesion.

Upper abdomen: Diffuse fatty infiltration of the liver. Left renal
cyst and left hepatic lobe cyst.

Review of the MIP images confirms the above findings.
IMPRESSION: 1. No definite CT findings for pulmonary embolism.
2. Bilateral lower lobe infiltrates, left greater than right with a
left-sided parapneumonic effusion.
3. Large hiatal hernia.
4. Normal thoracic aorta.

## 2015-06-25 DIAGNOSIS — I10 Essential (primary) hypertension: Secondary | ICD-10-CM | POA: Diagnosis not present

## 2015-06-25 DIAGNOSIS — K219 Gastro-esophageal reflux disease without esophagitis: Secondary | ICD-10-CM | POA: Diagnosis not present

## 2015-06-25 DIAGNOSIS — E785 Hyperlipidemia, unspecified: Secondary | ICD-10-CM | POA: Diagnosis not present

## 2015-06-25 DIAGNOSIS — Z79899 Other long term (current) drug therapy: Secondary | ICD-10-CM | POA: Diagnosis not present

## 2015-07-01 DIAGNOSIS — I10 Essential (primary) hypertension: Secondary | ICD-10-CM | POA: Diagnosis not present

## 2015-07-01 DIAGNOSIS — Z23 Encounter for immunization: Secondary | ICD-10-CM | POA: Diagnosis not present

## 2015-07-01 DIAGNOSIS — E785 Hyperlipidemia, unspecified: Secondary | ICD-10-CM | POA: Diagnosis not present

## 2015-07-01 DIAGNOSIS — Z6827 Body mass index (BMI) 27.0-27.9, adult: Secondary | ICD-10-CM | POA: Diagnosis not present

## 2015-12-04 DIAGNOSIS — L2089 Other atopic dermatitis: Secondary | ICD-10-CM | POA: Diagnosis not present

## 2015-12-09 DIAGNOSIS — Z79899 Other long term (current) drug therapy: Secondary | ICD-10-CM | POA: Diagnosis not present

## 2015-12-09 DIAGNOSIS — Z85828 Personal history of other malignant neoplasm of skin: Secondary | ICD-10-CM | POA: Diagnosis not present

## 2015-12-09 DIAGNOSIS — L280 Lichen simplex chronicus: Secondary | ICD-10-CM | POA: Diagnosis not present

## 2015-12-09 DIAGNOSIS — L2089 Other atopic dermatitis: Secondary | ICD-10-CM | POA: Diagnosis not present

## 2016-01-01 DIAGNOSIS — I2510 Atherosclerotic heart disease of native coronary artery without angina pectoris: Secondary | ICD-10-CM | POA: Diagnosis not present

## 2016-01-01 DIAGNOSIS — I10 Essential (primary) hypertension: Secondary | ICD-10-CM | POA: Diagnosis not present

## 2016-01-02 DIAGNOSIS — H1859 Other hereditary corneal dystrophies: Secondary | ICD-10-CM | POA: Diagnosis not present

## 2016-01-02 DIAGNOSIS — H5000 Unspecified esotropia: Secondary | ICD-10-CM | POA: Diagnosis not present

## 2016-01-02 DIAGNOSIS — H524 Presbyopia: Secondary | ICD-10-CM | POA: Diagnosis not present

## 2016-01-02 DIAGNOSIS — H532 Diplopia: Secondary | ICD-10-CM | POA: Diagnosis not present

## 2016-02-11 DIAGNOSIS — R52 Pain, unspecified: Secondary | ICD-10-CM | POA: Diagnosis not present

## 2016-02-11 DIAGNOSIS — R279 Unspecified lack of coordination: Secondary | ICD-10-CM | POA: Diagnosis not present

## 2016-02-11 DIAGNOSIS — R531 Weakness: Secondary | ICD-10-CM | POA: Diagnosis not present

## 2016-02-16 DIAGNOSIS — H1859 Other hereditary corneal dystrophies: Secondary | ICD-10-CM | POA: Diagnosis not present

## 2016-02-16 DIAGNOSIS — H1789 Other corneal scars and opacities: Secondary | ICD-10-CM | POA: Diagnosis not present

## 2016-03-12 DIAGNOSIS — H04122 Dry eye syndrome of left lacrimal gland: Secondary | ICD-10-CM | POA: Diagnosis not present

## 2016-04-23 DIAGNOSIS — H1859 Other hereditary corneal dystrophies: Secondary | ICD-10-CM | POA: Diagnosis not present

## 2016-04-23 DIAGNOSIS — H5000 Unspecified esotropia: Secondary | ICD-10-CM | POA: Diagnosis not present

## 2016-04-23 DIAGNOSIS — H532 Diplopia: Secondary | ICD-10-CM | POA: Diagnosis not present

## 2016-06-04 DIAGNOSIS — H1859 Other hereditary corneal dystrophies: Secondary | ICD-10-CM | POA: Diagnosis not present

## 2016-06-04 DIAGNOSIS — H532 Diplopia: Secondary | ICD-10-CM | POA: Diagnosis not present

## 2016-06-30 DIAGNOSIS — Z79899 Other long term (current) drug therapy: Secondary | ICD-10-CM | POA: Diagnosis not present

## 2016-06-30 DIAGNOSIS — E785 Hyperlipidemia, unspecified: Secondary | ICD-10-CM | POA: Diagnosis not present

## 2016-06-30 DIAGNOSIS — I10 Essential (primary) hypertension: Secondary | ICD-10-CM | POA: Diagnosis not present

## 2016-06-30 DIAGNOSIS — M1990 Unspecified osteoarthritis, unspecified site: Secondary | ICD-10-CM | POA: Diagnosis not present

## 2016-06-30 DIAGNOSIS — K219 Gastro-esophageal reflux disease without esophagitis: Secondary | ICD-10-CM | POA: Diagnosis not present

## 2016-07-06 DIAGNOSIS — I10 Essential (primary) hypertension: Secondary | ICD-10-CM | POA: Diagnosis not present

## 2016-07-06 DIAGNOSIS — I2510 Atherosclerotic heart disease of native coronary artery without angina pectoris: Secondary | ICD-10-CM | POA: Diagnosis not present

## 2016-07-06 DIAGNOSIS — Z23 Encounter for immunization: Secondary | ICD-10-CM | POA: Diagnosis not present

## 2016-07-06 DIAGNOSIS — E785 Hyperlipidemia, unspecified: Secondary | ICD-10-CM | POA: Diagnosis not present

## 2016-07-28 DIAGNOSIS — L2089 Other atopic dermatitis: Secondary | ICD-10-CM | POA: Diagnosis not present

## 2016-07-28 DIAGNOSIS — L570 Actinic keratosis: Secondary | ICD-10-CM | POA: Diagnosis not present

## 2016-07-28 DIAGNOSIS — Z85828 Personal history of other malignant neoplasm of skin: Secondary | ICD-10-CM | POA: Diagnosis not present

## 2016-07-28 DIAGNOSIS — I781 Nevus, non-neoplastic: Secondary | ICD-10-CM | POA: Diagnosis not present

## 2017-01-06 DIAGNOSIS — Z6827 Body mass index (BMI) 27.0-27.9, adult: Secondary | ICD-10-CM | POA: Diagnosis not present

## 2017-01-06 DIAGNOSIS — I10 Essential (primary) hypertension: Secondary | ICD-10-CM | POA: Diagnosis not present

## 2017-01-06 DIAGNOSIS — I2510 Atherosclerotic heart disease of native coronary artery without angina pectoris: Secondary | ICD-10-CM | POA: Diagnosis not present

## 2017-03-15 DIAGNOSIS — H43813 Vitreous degeneration, bilateral: Secondary | ICD-10-CM | POA: Diagnosis not present

## 2017-03-15 DIAGNOSIS — H5201 Hypermetropia, right eye: Secondary | ICD-10-CM | POA: Diagnosis not present

## 2017-03-15 DIAGNOSIS — H1859 Other hereditary corneal dystrophies: Secondary | ICD-10-CM | POA: Diagnosis not present

## 2017-03-15 DIAGNOSIS — H532 Diplopia: Secondary | ICD-10-CM | POA: Diagnosis not present

## 2017-06-14 DIAGNOSIS — Z23 Encounter for immunization: Secondary | ICD-10-CM | POA: Diagnosis not present

## 2017-06-28 DIAGNOSIS — I10 Essential (primary) hypertension: Secondary | ICD-10-CM | POA: Diagnosis not present

## 2017-06-28 DIAGNOSIS — I2510 Atherosclerotic heart disease of native coronary artery without angina pectoris: Secondary | ICD-10-CM | POA: Diagnosis not present

## 2017-06-28 DIAGNOSIS — Z79899 Other long term (current) drug therapy: Secondary | ICD-10-CM | POA: Diagnosis not present

## 2017-07-07 DIAGNOSIS — I10 Essential (primary) hypertension: Secondary | ICD-10-CM | POA: Diagnosis not present

## 2017-07-07 DIAGNOSIS — R7303 Prediabetes: Secondary | ICD-10-CM | POA: Diagnosis not present

## 2017-07-07 DIAGNOSIS — E785 Hyperlipidemia, unspecified: Secondary | ICD-10-CM | POA: Diagnosis not present

## 2017-08-10 DIAGNOSIS — D1801 Hemangioma of skin and subcutaneous tissue: Secondary | ICD-10-CM | POA: Diagnosis not present

## 2017-08-10 DIAGNOSIS — L570 Actinic keratosis: Secondary | ICD-10-CM | POA: Diagnosis not present

## 2017-08-10 DIAGNOSIS — L2089 Other atopic dermatitis: Secondary | ICD-10-CM | POA: Diagnosis not present

## 2017-08-10 DIAGNOSIS — Z7189 Other specified counseling: Secondary | ICD-10-CM | POA: Diagnosis not present

## 2017-08-10 DIAGNOSIS — D229 Melanocytic nevi, unspecified: Secondary | ICD-10-CM | POA: Diagnosis not present

## 2017-12-27 DIAGNOSIS — I2510 Atherosclerotic heart disease of native coronary artery without angina pectoris: Secondary | ICD-10-CM | POA: Diagnosis not present

## 2017-12-27 DIAGNOSIS — I10 Essential (primary) hypertension: Secondary | ICD-10-CM | POA: Diagnosis not present

## 2018-03-27 DIAGNOSIS — H1859 Other hereditary corneal dystrophies: Secondary | ICD-10-CM | POA: Diagnosis not present

## 2018-03-27 DIAGNOSIS — H5000 Unspecified esotropia: Secondary | ICD-10-CM | POA: Diagnosis not present

## 2018-03-27 DIAGNOSIS — H52203 Unspecified astigmatism, bilateral: Secondary | ICD-10-CM | POA: Diagnosis not present

## 2018-03-27 DIAGNOSIS — H43813 Vitreous degeneration, bilateral: Secondary | ICD-10-CM | POA: Diagnosis not present

## 2018-06-09 DIAGNOSIS — Z23 Encounter for immunization: Secondary | ICD-10-CM | POA: Diagnosis not present

## 2018-06-21 DIAGNOSIS — I10 Essential (primary) hypertension: Secondary | ICD-10-CM | POA: Diagnosis not present

## 2018-06-21 DIAGNOSIS — Z79899 Other long term (current) drug therapy: Secondary | ICD-10-CM | POA: Diagnosis not present

## 2018-06-21 DIAGNOSIS — I2510 Atherosclerotic heart disease of native coronary artery without angina pectoris: Secondary | ICD-10-CM | POA: Diagnosis not present

## 2018-06-27 DIAGNOSIS — I10 Essential (primary) hypertension: Secondary | ICD-10-CM | POA: Diagnosis not present

## 2018-06-27 DIAGNOSIS — E785 Hyperlipidemia, unspecified: Secondary | ICD-10-CM | POA: Diagnosis not present

## 2018-06-27 DIAGNOSIS — R7303 Prediabetes: Secondary | ICD-10-CM | POA: Diagnosis not present

## 2018-08-16 DIAGNOSIS — L720 Epidermal cyst: Secondary | ICD-10-CM | POA: Diagnosis not present

## 2018-08-16 DIAGNOSIS — Z85828 Personal history of other malignant neoplasm of skin: Secondary | ICD-10-CM | POA: Diagnosis not present

## 2018-08-16 DIAGNOSIS — L821 Other seborrheic keratosis: Secondary | ICD-10-CM | POA: Diagnosis not present

## 2018-08-16 DIAGNOSIS — L2089 Other atopic dermatitis: Secondary | ICD-10-CM | POA: Diagnosis not present

## 2019-01-12 DIAGNOSIS — I10 Essential (primary) hypertension: Secondary | ICD-10-CM | POA: Diagnosis not present

## 2019-01-12 DIAGNOSIS — I2510 Atherosclerotic heart disease of native coronary artery without angina pectoris: Secondary | ICD-10-CM | POA: Diagnosis not present

## 2019-06-04 DIAGNOSIS — Z23 Encounter for immunization: Secondary | ICD-10-CM | POA: Diagnosis not present

## 2019-07-19 DIAGNOSIS — I10 Essential (primary) hypertension: Secondary | ICD-10-CM | POA: Diagnosis not present

## 2019-07-19 DIAGNOSIS — Z79899 Other long term (current) drug therapy: Secondary | ICD-10-CM | POA: Diagnosis not present

## 2019-07-19 DIAGNOSIS — I2510 Atherosclerotic heart disease of native coronary artery without angina pectoris: Secondary | ICD-10-CM | POA: Diagnosis not present

## 2019-07-19 DIAGNOSIS — R7301 Impaired fasting glucose: Secondary | ICD-10-CM | POA: Diagnosis not present

## 2019-08-24 DIAGNOSIS — Z23 Encounter for immunization: Secondary | ICD-10-CM | POA: Diagnosis not present

## 2019-09-21 DIAGNOSIS — Z23 Encounter for immunization: Secondary | ICD-10-CM | POA: Diagnosis not present

## 2020-03-04 DIAGNOSIS — H02051 Trichiasis without entropian right upper eyelid: Secondary | ICD-10-CM | POA: Diagnosis not present

## 2020-03-04 DIAGNOSIS — H524 Presbyopia: Secondary | ICD-10-CM | POA: Diagnosis not present

## 2020-03-04 DIAGNOSIS — H18593 Other hereditary corneal dystrophies, bilateral: Secondary | ICD-10-CM | POA: Diagnosis not present

## 2020-03-04 DIAGNOSIS — H43813 Vitreous degeneration, bilateral: Secondary | ICD-10-CM | POA: Diagnosis not present

## 2020-03-26 DIAGNOSIS — Z85828 Personal history of other malignant neoplasm of skin: Secondary | ICD-10-CM | POA: Diagnosis not present

## 2020-03-26 DIAGNOSIS — L821 Other seborrheic keratosis: Secondary | ICD-10-CM | POA: Diagnosis not present

## 2020-03-26 DIAGNOSIS — D239 Other benign neoplasm of skin, unspecified: Secondary | ICD-10-CM | POA: Diagnosis not present

## 2020-03-26 DIAGNOSIS — L2089 Other atopic dermatitis: Secondary | ICD-10-CM | POA: Diagnosis not present

## 2020-03-26 DIAGNOSIS — L209 Atopic dermatitis, unspecified: Secondary | ICD-10-CM | POA: Diagnosis not present

## 2020-06-09 DIAGNOSIS — Z23 Encounter for immunization: Secondary | ICD-10-CM | POA: Diagnosis not present

## 2020-06-26 DIAGNOSIS — Z23 Encounter for immunization: Secondary | ICD-10-CM | POA: Diagnosis not present

## 2020-07-21 DIAGNOSIS — Z79899 Other long term (current) drug therapy: Secondary | ICD-10-CM | POA: Diagnosis not present

## 2020-07-21 DIAGNOSIS — I10 Essential (primary) hypertension: Secondary | ICD-10-CM | POA: Diagnosis not present

## 2020-07-21 DIAGNOSIS — R7303 Prediabetes: Secondary | ICD-10-CM | POA: Diagnosis not present

## 2020-07-28 DIAGNOSIS — I10 Essential (primary) hypertension: Secondary | ICD-10-CM | POA: Diagnosis not present

## 2020-07-28 DIAGNOSIS — D696 Thrombocytopenia, unspecified: Secondary | ICD-10-CM | POA: Diagnosis not present

## 2020-07-28 DIAGNOSIS — R7309 Other abnormal glucose: Secondary | ICD-10-CM | POA: Diagnosis not present

## 2020-07-28 DIAGNOSIS — D690 Allergic purpura: Secondary | ICD-10-CM | POA: Diagnosis not present

## 2020-07-28 DIAGNOSIS — R7303 Prediabetes: Secondary | ICD-10-CM | POA: Diagnosis not present

## 2020-07-28 DIAGNOSIS — E785 Hyperlipidemia, unspecified: Secondary | ICD-10-CM | POA: Diagnosis not present

## 2021-02-06 DIAGNOSIS — I10 Essential (primary) hypertension: Secondary | ICD-10-CM | POA: Diagnosis not present

## 2021-02-06 DIAGNOSIS — I2510 Atherosclerotic heart disease of native coronary artery without angina pectoris: Secondary | ICD-10-CM | POA: Diagnosis not present

## 2021-06-15 DIAGNOSIS — Z23 Encounter for immunization: Secondary | ICD-10-CM | POA: Diagnosis not present

## 2021-07-10 DIAGNOSIS — H532 Diplopia: Secondary | ICD-10-CM | POA: Diagnosis not present

## 2021-07-10 DIAGNOSIS — H43813 Vitreous degeneration, bilateral: Secondary | ICD-10-CM | POA: Diagnosis not present

## 2021-07-10 DIAGNOSIS — H524 Presbyopia: Secondary | ICD-10-CM | POA: Diagnosis not present

## 2021-07-10 DIAGNOSIS — H02051 Trichiasis without entropian right upper eyelid: Secondary | ICD-10-CM | POA: Diagnosis not present

## 2021-08-19 DIAGNOSIS — D1801 Hemangioma of skin and subcutaneous tissue: Secondary | ICD-10-CM | POA: Diagnosis not present

## 2021-08-19 DIAGNOSIS — L821 Other seborrheic keratosis: Secondary | ICD-10-CM | POA: Diagnosis not present

## 2021-08-19 DIAGNOSIS — L2089 Other atopic dermatitis: Secondary | ICD-10-CM | POA: Diagnosis not present

## 2021-08-19 DIAGNOSIS — L209 Atopic dermatitis, unspecified: Secondary | ICD-10-CM | POA: Diagnosis not present

## 2021-08-19 DIAGNOSIS — Z85828 Personal history of other malignant neoplasm of skin: Secondary | ICD-10-CM | POA: Diagnosis not present

## 2021-08-19 DIAGNOSIS — D239 Other benign neoplasm of skin, unspecified: Secondary | ICD-10-CM | POA: Diagnosis not present

## 2022-02-16 DIAGNOSIS — K219 Gastro-esophageal reflux disease without esophagitis: Secondary | ICD-10-CM | POA: Diagnosis not present

## 2022-02-16 DIAGNOSIS — I10 Essential (primary) hypertension: Secondary | ICD-10-CM | POA: Diagnosis not present

## 2022-04-23 ENCOUNTER — Other Ambulatory Visit: Payer: Self-pay

## 2022-04-23 ENCOUNTER — Ambulatory Visit
Admission: EM | Admit: 2022-04-23 | Discharge: 2022-04-23 | Disposition: A | Discharge status: Home / Self Care | Payer: Medicare Other | Attending: Nurse Practitioner | Admitting: Nurse Practitioner

## 2022-04-23 ENCOUNTER — Encounter: Payer: Self-pay | Admitting: Emergency Medicine

## 2022-04-23 DIAGNOSIS — R21 Rash and other nonspecific skin eruption: Secondary | ICD-10-CM | POA: Diagnosis not present

## 2022-04-23 MED ORDER — TRIAMCINOLONE ACETONIDE 0.1 % EX OINT: 1.0000 | TOPICAL_OINTMENT | Freq: Two times a day (BID) | CUTANEOUS | 0 refills | Status: DC

## 2022-04-23 MED ORDER — PREDNISONE 10 MG (21) PO TBPK: ORAL_TABLET | ORAL | 0 refills | Status: AC

## 2022-04-23 MED ORDER — DEXAMETHASONE SODIUM PHOSPHATE 10 MG/ML IJ SOLN
10.0000 mg | Freq: Once | INTRAMUSCULAR | Status: AC
  Administered 2022-04-23: 10 mg via INTRAMUSCULAR

## 2022-04-23 NOTE — Discharge Instructions (Signed)
We have given you a shot of steroid today to help with the itching and inflammation.  Use the triamcinolone ointment twice daily to the itchy areas.  Continue an oral antihistamine like cetirizine, fexofenadine, or loratadine to help with the itching.

## 2022-04-23 NOTE — ED Triage Notes (Signed)
Pt reports was working in the yard and cut a vine off of a tree and reports generalized rash that is getting worse on BLE. Pt reports was seen by pcp for same and prescribed prednisone that ended on Tuesday.

## 2022-04-23 NOTE — ED Provider Notes (Signed)
[facility]    CSN: [id number] Arrival date & time: 04/23/22  1510      History   Chief Complaint Chief Complaint  Patient presents with   Rash    HPI Randy Maddox is a 81 y.o. male.   Patient presents for rash that has been ongoing for the past week.  Reports he was cutting vines outside of his home and his wife also has a similar rash.  Reports the rash is on his trunk, arms, and bilateral lower extremities.  It is intensely itchy and red.  Denies any oozing, scaling, blisters, or pain.  No fevers or nausea/vomiting.  Denies any change in detergents, soaps, or personal care products.  No shortness of breath, throat or tongue swelling, or new muscle pain/findings.  Reports he was treated with 5 days of prednisone earlier this week that did not help.  He also takes triamcinolone cream for a chronic condition of the skin.    Past Medical History:  Diagnosis Date   GERD (gastroesophageal reflux disease)    Hypercholesteremia    Hypertension    Pneumothorax    left    Patient Active Problem List   Diagnosis Date Noted   CAP (community acquired pneumonia) 08/13/2014   Hypertension 08/13/2014   Community acquired pneumonia 08/13/2014    Past Surgical History:  Procedure Laterality Date   CHEST TUBE INSERTION     38 years ago due to spontaneous pneumothorax   COLONOSCOPY     COLONOSCOPY  08/17/2012   Procedure: COLONOSCOPY;  Surgeon: Daneil Dolin, MD;  Location: [facility];  Service: Endoscopy;  Laterality: N/A;  10:30 AM-moved to 10:45 Leigh Ann notified pt   CORONARY ANGIOPLASTY WITH STENT PLACEMENT     ESOPHAGOGASTRODUODENOSCOPY (EGD) WITH ESOPHAGEAL DILATION         Home Medications    Prior to Admission medications   Medication Sig Start Date End Date Taking? Authorizing Provider  predniSONE (STERAPRED UNI-PAK 21 TAB) 10 MG (21) TBPK tablet Take 6 tablets (60 mg total) by mouth daily for 1 day, THEN 5 tablets (50 mg total) daily for 1 day, THEN  4 tablets (40 mg total) daily for 1 day, THEN 3 tablets (30 mg total) daily for 1 day, THEN 2 tablets (20 mg total) daily for 1 day, THEN 1 tablet (10 mg total) daily for 1 day. 04/23/22 04/29/22 Yes Noemi Chapel A, NP  triamcinolone ointment (KENALOG) 0.1 % Apply 1 Application topically 2 (two) times daily. Apply sparingly to affected areas 04/23/22  Yes Eulogio Bear, NP  aspirin EC 81 MG tablet Take 81 mg by mouth daily.    [provider]  benazepril (LOTENSIN) 20 MG tablet Take 20 mg by mouth daily.    [provider]  Multiple Vitamin (MULTIVITAMIN WITH MINERALS) TABS Take 1 tablet by mouth daily.    [provider]  omeprazole (PRILOSEC) 40 MG capsule Take 40 mg by mouth daily.    [provider]  simvastatin (ZOCOR) 40 MG tablet Take 40 mg by mouth every evening.    [provider]    Family History Family History  Problem Relation Age of Onset   Colon cancer Neg Hx     Social History Social History   Tobacco Use   Smoking status: Never   Smokeless tobacco: Never  Substance Use Topics   Alcohol use: No   Drug use: No     Allergies   Darvocet [propoxyphene n-acetaminophen], Aleve [  naproxen sodium], Vioxx [rofecoxib], Actifed cold-allergy [chlorpheniramine-phenyleph er], Meperidine and related, and Sudafed [pseudoephedrine hcl]   Review of Systems Review of Systems Per HPI  Physical Exam Triage Vital Signs ED Triage Vitals [04/23/22 1539]  Enc Vitals Group     BP (!) 136/94     Pulse Rate 96     Resp 20     Temp 97.7 F (36.5 C)     Temp Source Oral     SpO2 98 %     Weight      Height      Head Circumference      Peak Flow      Pain Score      Pain Loc      Pain Edu?      Excl. in [place]?    No data found.  Updated Vital Signs BP (!) 136/94 (BP Location: Right Arm)   Pulse 96   Temp 97.7 F (36.5 C) (Oral)   Resp 20   SpO2 98%   Visual Acuity Right Eye Distance:   Left Eye Distance:   Bilateral  Distance:    Right Eye Near:   Left Eye Near:    Bilateral Near:     Physical Exam Vitals and nursing note reviewed.  Constitutional:      General: He is not in acute distress.    Appearance: Normal appearance. He is not toxic-appearing.  HENT:     Head: Normocephalic and atraumatic.     Nose: Nose normal. No congestion.     Mouth/Throat:     Mouth: Mucous membranes are moist.     Pharynx: Oropharynx is clear.  Pulmonary:     Effort: Pulmonary effort is normal. No respiratory distress.  Skin:    General: Skin is warm and dry.     Capillary Refill: Capillary refill takes less than 2 seconds.     Findings: Rash present. Rash is macular and papular.     Comments: Erythematous maculopapular rash to bilateral lower extremities; no surrounding fluctuance, active drainage, or warmth.    Neurological:     Mental Status: He is alert and oriented to person, place, and time.  Psychiatric:        Behavior: Behavior is cooperative.      UC Treatments / Results  Labs (all labs ordered are listed, but only abnormal results are displayed) Labs Reviewed - No data to display  EKG   Radiology No results found.  Procedures Procedures (including critical care time)  Medications Ordered in UC Medications  dexamethasone (DECADRON) injection 10 mg (has no administration in time range)    Initial Impression / Assessment and Plan / UC Course  I have reviewed the triage vital signs and the nursing notes.  Pertinent labs & imaging results that were available during my care of the patient were reviewed by me and considered in my medical decision making (see chart for details).    Patient is well-appearing, normotensive, afebrile, not tachycardic, not tachypneic, oxygenating well on room air.  Treat rash-likely contact to poison oak, ivy, or sumac-with prednisone taper starting tomorrow.  Decadron 10 mg IM given today in urgent care.  Also start triamcinolone ointment to the affected  areas.  Encouraged non drowsy oral antihistamine.  ER precautions and return precautions discussed.  The patient was given the opportunity to ask questions.  All questions answered to their satisfaction.  The patient is in agreement to this plan.   Final Clinical Impressions(s) / UC Diagnoses  Final diagnoses:  Rash and nonspecific skin eruption     Discharge Instructions      We have given you a shot of steroid today to help with the itching and inflammation.  Use the triamcinolone ointment twice daily to the itchy areas.  Continue an oral antihistamine like cetirizine, fexofenadine, or loratadine to help with the itching.       ED Prescriptions     Medication Sig Dispense Auth. Provider   triamcinolone ointment (KENALOG) 0.1 % Apply 1 Application topically 2 (two) times daily. Apply sparingly to affected areas 30 g Noemi Chapel A, NP   predniSONE (STERAPRED UNI-PAK 21 TAB) 10 MG (21) TBPK tablet Take 6 tablets (60 mg total) by mouth daily for 1 day, THEN 5 tablets (50 mg total) daily for 1 day, THEN 4 tablets (40 mg total) daily for 1 day, THEN 3 tablets (30 mg total) daily for 1 day, THEN 2 tablets (20 mg total) daily for 1 day, THEN 1 tablet (10 mg total) daily for 1 day. 21 each Eulogio Bear, NP      PDMP not reviewed this encounter.   Eulogio Bear, NP 04/23/22 1614

## 2022-05-19 DIAGNOSIS — E785 Hyperlipidemia, unspecified: Secondary | ICD-10-CM | POA: Diagnosis not present

## 2022-05-19 DIAGNOSIS — I10 Essential (primary) hypertension: Secondary | ICD-10-CM | POA: Diagnosis not present

## 2022-05-19 DIAGNOSIS — L989 Disorder of the skin and subcutaneous tissue, unspecified: Secondary | ICD-10-CM | POA: Diagnosis not present

## 2022-05-19 DIAGNOSIS — I2510 Atherosclerotic heart disease of native coronary artery without angina pectoris: Secondary | ICD-10-CM | POA: Diagnosis not present

## 2022-05-19 DIAGNOSIS — Z0189 Encounter for other specified special examinations: Secondary | ICD-10-CM | POA: Diagnosis not present

## 2022-05-19 DIAGNOSIS — Z23 Encounter for immunization: Secondary | ICD-10-CM | POA: Diagnosis not present

## 2022-05-19 DIAGNOSIS — K219 Gastro-esophageal reflux disease without esophagitis: Secondary | ICD-10-CM | POA: Diagnosis not present

## 2022-05-19 DIAGNOSIS — J302 Other seasonal allergic rhinitis: Secondary | ICD-10-CM | POA: Diagnosis not present

## 2022-06-17 DIAGNOSIS — E785 Hyperlipidemia, unspecified: Secondary | ICD-10-CM | POA: Diagnosis not present

## 2022-06-17 DIAGNOSIS — I10 Essential (primary) hypertension: Secondary | ICD-10-CM | POA: Diagnosis not present

## 2022-06-17 DIAGNOSIS — I2510 Atherosclerotic heart disease of native coronary artery without angina pectoris: Secondary | ICD-10-CM | POA: Diagnosis not present

## 2022-06-24 DIAGNOSIS — J302 Other seasonal allergic rhinitis: Secondary | ICD-10-CM | POA: Diagnosis not present

## 2022-06-24 DIAGNOSIS — I10 Essential (primary) hypertension: Secondary | ICD-10-CM | POA: Diagnosis not present

## 2022-06-24 DIAGNOSIS — K219 Gastro-esophageal reflux disease without esophagitis: Secondary | ICD-10-CM | POA: Diagnosis not present

## 2022-06-24 DIAGNOSIS — I2510 Atherosclerotic heart disease of native coronary artery without angina pectoris: Secondary | ICD-10-CM | POA: Diagnosis not present

## 2022-06-24 DIAGNOSIS — D696 Thrombocytopenia, unspecified: Secondary | ICD-10-CM | POA: Diagnosis not present

## 2022-06-24 DIAGNOSIS — L989 Disorder of the skin and subcutaneous tissue, unspecified: Secondary | ICD-10-CM | POA: Diagnosis not present

## 2022-06-24 DIAGNOSIS — E785 Hyperlipidemia, unspecified: Secondary | ICD-10-CM | POA: Diagnosis not present

## 2022-06-24 DIAGNOSIS — D649 Anemia, unspecified: Secondary | ICD-10-CM | POA: Diagnosis not present

## 2022-07-14 DIAGNOSIS — H5000 Unspecified esotropia: Secondary | ICD-10-CM | POA: Diagnosis not present

## 2022-07-14 DIAGNOSIS — Z961 Presence of intraocular lens: Secondary | ICD-10-CM | POA: Diagnosis not present

## 2022-07-14 DIAGNOSIS — H02051 Trichiasis without entropian right upper eyelid: Secondary | ICD-10-CM | POA: Diagnosis not present

## 2022-07-21 ENCOUNTER — Encounter: Payer: Self-pay | Admitting: *Deleted

## 2022-08-19 DIAGNOSIS — L309 Dermatitis, unspecified: Secondary | ICD-10-CM | POA: Diagnosis not present

## 2022-08-19 DIAGNOSIS — L2089 Other atopic dermatitis: Secondary | ICD-10-CM | POA: Diagnosis not present

## 2022-08-19 DIAGNOSIS — L821 Other seborrheic keratosis: Secondary | ICD-10-CM | POA: Diagnosis not present

## 2022-08-19 DIAGNOSIS — Z85828 Personal history of other malignant neoplasm of skin: Secondary | ICD-10-CM | POA: Diagnosis not present

## 2022-08-19 DIAGNOSIS — D1801 Hemangioma of skin and subcutaneous tissue: Secondary | ICD-10-CM | POA: Diagnosis not present

## 2022-12-17 DIAGNOSIS — I2510 Atherosclerotic heart disease of native coronary artery without angina pectoris: Secondary | ICD-10-CM | POA: Diagnosis not present

## 2022-12-17 DIAGNOSIS — E785 Hyperlipidemia, unspecified: Secondary | ICD-10-CM | POA: Diagnosis not present

## 2022-12-17 DIAGNOSIS — I10 Essential (primary) hypertension: Secondary | ICD-10-CM | POA: Diagnosis not present

## 2022-12-23 DIAGNOSIS — I10 Essential (primary) hypertension: Secondary | ICD-10-CM | POA: Diagnosis not present

## 2022-12-23 DIAGNOSIS — L989 Disorder of the skin and subcutaneous tissue, unspecified: Secondary | ICD-10-CM | POA: Diagnosis not present

## 2022-12-23 DIAGNOSIS — I2510 Atherosclerotic heart disease of native coronary artery without angina pectoris: Secondary | ICD-10-CM | POA: Diagnosis not present

## 2022-12-23 DIAGNOSIS — Z713 Dietary counseling and surveillance: Secondary | ICD-10-CM | POA: Diagnosis not present

## 2022-12-23 DIAGNOSIS — J302 Other seasonal allergic rhinitis: Secondary | ICD-10-CM | POA: Diagnosis not present

## 2022-12-23 DIAGNOSIS — D696 Thrombocytopenia, unspecified: Secondary | ICD-10-CM | POA: Diagnosis not present

## 2022-12-23 DIAGNOSIS — R7303 Prediabetes: Secondary | ICD-10-CM | POA: Diagnosis not present

## 2022-12-23 DIAGNOSIS — K219 Gastro-esophageal reflux disease without esophagitis: Secondary | ICD-10-CM | POA: Diagnosis not present

## 2022-12-23 DIAGNOSIS — E785 Hyperlipidemia, unspecified: Secondary | ICD-10-CM | POA: Diagnosis not present

## 2022-12-23 DIAGNOSIS — I119 Hypertensive heart disease without heart failure: Secondary | ICD-10-CM | POA: Diagnosis not present

## 2022-12-23 DIAGNOSIS — D649 Anemia, unspecified: Secondary | ICD-10-CM | POA: Diagnosis not present

## 2023-06-06 DIAGNOSIS — K649 Unspecified hemorrhoids: Secondary | ICD-10-CM | POA: Diagnosis not present

## 2023-06-06 DIAGNOSIS — K469 Unspecified abdominal hernia without obstruction or gangrene: Secondary | ICD-10-CM | POA: Diagnosis not present

## 2023-06-08 ENCOUNTER — Other Ambulatory Visit (HOSPITAL_COMMUNITY): Payer: Self-pay | Admitting: Internal Medicine

## 2023-06-08 DIAGNOSIS — K469 Unspecified abdominal hernia without obstruction or gangrene: Secondary | ICD-10-CM

## 2023-06-15 DIAGNOSIS — E785 Hyperlipidemia, unspecified: Secondary | ICD-10-CM | POA: Diagnosis not present

## 2023-06-15 DIAGNOSIS — I10 Essential (primary) hypertension: Secondary | ICD-10-CM | POA: Diagnosis not present

## 2023-06-15 DIAGNOSIS — R7303 Prediabetes: Secondary | ICD-10-CM | POA: Diagnosis not present

## 2023-06-17 ENCOUNTER — Ambulatory Visit (HOSPITAL_COMMUNITY)
Admission: RE | Admit: 2023-06-17 | Discharge: 2023-06-17 | Disposition: A | Discharge status: Home / Self Care | Payer: Medicare Other | Source: Ambulatory Visit | Attending: Internal Medicine | Admitting: Internal Medicine

## 2023-06-17 DIAGNOSIS — R109 Unspecified abdominal pain: Secondary | ICD-10-CM | POA: Diagnosis not present

## 2023-06-17 DIAGNOSIS — K469 Unspecified abdominal hernia without obstruction or gangrene: Secondary | ICD-10-CM | POA: Diagnosis not present

## 2023-06-21 DIAGNOSIS — D649 Anemia, unspecified: Secondary | ICD-10-CM | POA: Diagnosis not present

## 2023-06-21 DIAGNOSIS — I2510 Atherosclerotic heart disease of native coronary artery without angina pectoris: Secondary | ICD-10-CM | POA: Diagnosis not present

## 2023-06-21 DIAGNOSIS — I10 Essential (primary) hypertension: Secondary | ICD-10-CM | POA: Diagnosis not present

## 2023-06-21 DIAGNOSIS — J302 Other seasonal allergic rhinitis: Secondary | ICD-10-CM | POA: Diagnosis not present

## 2023-06-21 DIAGNOSIS — E785 Hyperlipidemia, unspecified: Secondary | ICD-10-CM | POA: Diagnosis not present

## 2023-06-21 DIAGNOSIS — R7303 Prediabetes: Secondary | ICD-10-CM | POA: Diagnosis not present

## 2023-06-21 DIAGNOSIS — L989 Disorder of the skin and subcutaneous tissue, unspecified: Secondary | ICD-10-CM | POA: Diagnosis not present

## 2023-06-21 DIAGNOSIS — Z23 Encounter for immunization: Secondary | ICD-10-CM | POA: Diagnosis not present

## 2023-06-21 DIAGNOSIS — D696 Thrombocytopenia, unspecified: Secondary | ICD-10-CM | POA: Diagnosis not present

## 2023-06-21 DIAGNOSIS — K469 Unspecified abdominal hernia without obstruction or gangrene: Secondary | ICD-10-CM | POA: Diagnosis not present

## 2023-06-21 DIAGNOSIS — K649 Unspecified hemorrhoids: Secondary | ICD-10-CM | POA: Diagnosis not present

## 2023-06-21 DIAGNOSIS — K219 Gastro-esophageal reflux disease without esophagitis: Secondary | ICD-10-CM | POA: Diagnosis not present

## 2023-06-27 ENCOUNTER — Other Ambulatory Visit: Payer: Self-pay | Admitting: *Deleted

## 2023-06-27 DIAGNOSIS — K4090 Unilateral inguinal hernia, without obstruction or gangrene, not specified as recurrent: Secondary | ICD-10-CM

## 2023-06-30 ENCOUNTER — Encounter: Payer: Self-pay | Admitting: General Surgery

## 2023-06-30 ENCOUNTER — Ambulatory Visit: Payer: Medicare Other | Admitting: General Surgery

## 2023-06-30 VITALS — BP 158/85 | HR 57 | Temp 97.6°F | Resp 14 | Ht 71.0 in | Wt 169.0 lb

## 2023-06-30 DIAGNOSIS — K4090 Unilateral inguinal hernia, without obstruction or gangrene, not specified as recurrent: Secondary | ICD-10-CM | POA: Diagnosis not present

## 2023-06-30 MED ORDER — TAMSULOSIN HCL 0.4 MG PO CAPS: 0.4000 mg | ORAL_CAPSULE | Freq: Every day | ORAL | 1 refills | Status: DC

## 2023-06-30 NOTE — Patient Instructions (Addendum)
Start taking the flomax to help with urinary retention and to hopefully avoid retention after surgery.

## 2023-07-01 DIAGNOSIS — K4090 Unilateral inguinal hernia, without obstruction or gangrene, not specified as recurrent: Secondary | ICD-10-CM

## 2023-07-01 NOTE — H&P (Signed)
[facility] History and Physical  Reason for Referral: Right inguinal hernia  Referring Physician: Benita Stabile, MD   Chief Complaint   New Patient (Initial Visit)     Randy Maddox is a 82 y.o. male.  HPI: Randy Maddox is a very sweet and health 82 yo. He says that he has noticed a bulge in the right groin for the last 3 months. This has been getting worse. He is having discomfort in the region and down into his leg. He says that the pain is worse when he has been on his feet at the end of the day. He is here to discuss his options. He does report with his colonoscopies that he had a drop in his BP with the anesthetic. He also describes frequent awakening at night to urinate and not on flomax.   Past Medical History:  Diagnosis Date   GERD (gastroesophageal reflux disease)    Hypercholesteremia    Hypertension    Pneumothorax    left    Past Surgical History:  Procedure Laterality Date   CHEST TUBE INSERTION     38 years ago due to spontaneous pneumothorax   COLONOSCOPY     COLONOSCOPY  08/17/2012   Procedure: COLONOSCOPY;  Surgeon: Corbin Ade, MD;  Location: [facility];  Service: Endoscopy;  Laterality: N/A;  10:30 AM-moved to 10:45 Leigh Ann notified pt   CORONARY ANGIOPLASTY WITH STENT PLACEMENT     ESOPHAGOGASTRODUODENOSCOPY (EGD) WITH ESOPHAGEAL DILATION      Family History  Problem Relation Age of Onset   Colon cancer Neg Hx     Social History   Tobacco Use   Smoking status: Never   Smokeless tobacco: Never  Substance Use Topics   Alcohol use: No   Drug use: No    Medications: I have reviewed the patient's current medications. Allergies as of 06/30/2023       Reactions   Darvocet [propoxyphene N-acetaminophen]    vomiting   Aleve [naproxen Sodium] Diarrhea   Vioxx [rofecoxib] Other (See Comments)   HEADACHE   Actifed Cold-allergy [chlorpheniramine-phenyleph Er]    "makes me hyper'   Meperidine And Related Nausea And Vomiting    Decrease in BP levels   Sudafed [pseudoephedrine Hcl]    "Makes me Hyper"        Medication List        Accurate as of June 30, 2023 11:59 PM. If you have any questions, ask your nurse or doctor.          STOP taking these medications    simvastatin 40 MG tablet Commonly known as: ZOCOR Stopped by: Lucretia Roers       TAKE these medications    aspirin EC 81 MG tablet Take 81 mg by mouth daily.   atorvastatin 40 MG tablet Commonly known as: LIPITOR Take 1 tablet by mouth daily.   benazepril 20 MG tablet Commonly known as: LOTENSIN Take 20 mg by mouth daily.   levocetirizine 5 MG tablet Commonly known as: XYZAL Take 5 mg by mouth every evening.   multivitamin with minerals Tabs tablet Take 1 tablet by mouth daily.   omeprazole 40 MG capsule Commonly known as: PRILOSEC Take 40 mg by mouth daily.   tamsulosin 0.4 MG Caps capsule Commonly known as: Flomax Take 1 capsule (0.4 mg total) by mouth daily after supper. Started by: Lucretia Roers   triamcinolone ointment 0.1 % Commonly known as: KENALOG Apply 1 Application topically  2 (two) times daily. Apply sparingly to affected areas         ROS:  A comprehensive review of systems was negative except for: Gastrointestinal: positive for right groin pain Genitourinary: positive for increased frequency at night  Blood pressure (!) 158/85, pulse (!) 57, temperature 97.6 F (36.4 C), temperature source Oral, resp. rate 14, height 5\' 11"  (1.803 m), weight 169 lb (76.7 kg), SpO2 96%. Physical Exam Vitals reviewed.  HENT:     Head: Normocephalic.     Nose: Nose normal.     Mouth/Throat:     Mouth: Mucous membranes are moist.  Eyes:     Extraocular Movements: Extraocular movements intact.  Cardiovascular:     Rate and Rhythm: Normal rate and regular rhythm.  Pulmonary:     Effort: Pulmonary effort is normal.     Breath sounds: Normal breath sounds.  Abdominal:     General: There is no  distension.     Palpations: Abdomen is soft.     Tenderness: There is no abdominal tenderness.     Hernia: A hernia is present. Hernia is present in the right inguinal area. There is no hernia in the left inguinal area.     Comments: Reducible right inguinal hernia  Musculoskeletal:        General: Normal range of motion.  Skin:    General: Skin is warm.  Neurological:     General: No focal deficit present.     Mental Status: He is alert and oriented to person, place, and time.  Psychiatric:        Mood and Affect: Mood normal.        Behavior: Behavior normal.        Thought Content: Thought content normal.        Judgment: Judgment normal.     Results: None   Assessment & Plan:  Randy Maddox is a 82 y.o. male with a right inguinal hernia that is symptomatic.   Discussed the risk and benefits including, bleeding, infection, use of mesh, risk of recurrence, risk of nerve damage causing numbness or changes in sensation, risk of damage to the cord structures. The patient understands the risk and benefits of repair with mesh, and has decided to proceed.  We also discussed open versus robotic assisted laparoscopic surgery and the use of mesh. We discussed that I do both robotic and open repairs with mesh, and that these are considered equivalent. We discussed reasons for opting for laparoscopic surgery including if a bilateral repair is needed or if a patient has a recurrence after an open repair. We discussed the option of watch and wait in men and discussed that in 5 years some studies report that 40% of men have crossed over to needing a hernia repair because the hernia has become larger or symptomatic. We discussed that women are not appropriate candidate for watchful waiting due to the risk of femoral hernias.   Plan for robotic repair with mesh.  Will start flomax given the risk of retention post op and discussed that he may have to go home with a catheter if he retains.   All  questions were answered to the satisfaction of the patient and family.   Lucretia Roers 07/01/2023, 8:01 AM

## 2023-07-01 NOTE — Progress Notes (Signed)
[facility] History and Physical  Reason for Referral: Right inguinal hernia  Referring Physician: Benita Stabile, MD   Chief Complaint   New Patient (Initial Visit)     Randy Maddox is a 82 y.o. male.  HPI: Randy Maddox is a very sweet and health 82 yo. Randy Maddox says that Randy Maddox has noticed a bulge in the right groin for the last 3 months. This has been getting worse. Randy Maddox is having discomfort in the region and down into his leg. Randy Maddox says that the pain is worse when Randy Maddox has been on his feet at the end of the day. Randy Maddox is here to discuss his options. Randy Maddox does report with his colonoscopies that Randy Maddox had a drop in his BP with the anesthetic. Randy Maddox also describes frequent awakening at night to urinate and not on flomax.   Past Medical History:  Diagnosis Date   GERD (gastroesophageal reflux disease)    Hypercholesteremia    Hypertension    Pneumothorax    left    Past Surgical History:  Procedure Laterality Date   CHEST TUBE INSERTION     38 years ago due to spontaneous pneumothorax   COLONOSCOPY     COLONOSCOPY  08/17/2012   Procedure: COLONOSCOPY;  Surgeon: Corbin Ade, MD;  Location: [facility];  Service: Endoscopy;  Laterality: N/A;  10:30 AM-moved to 10:45 Leigh Ann notified pt   CORONARY ANGIOPLASTY WITH STENT PLACEMENT     ESOPHAGOGASTRODUODENOSCOPY (EGD) WITH ESOPHAGEAL DILATION      Family History  Problem Relation Age of Onset   Colon cancer Neg Hx     Social History   Tobacco Use   Smoking status: Never   Smokeless tobacco: Never  Substance Use Topics   Alcohol use: No   Drug use: No    Medications: I have reviewed the patient's current medications. Allergies as of 06/30/2023       Reactions   Darvocet [propoxyphene N-acetaminophen]    vomiting   Aleve [naproxen Sodium] Diarrhea   Vioxx [rofecoxib] Other (See Comments)   HEADACHE   Actifed Cold-allergy [chlorpheniramine-phenyleph Er]    "makes me hyper'   Meperidine And Related Nausea And Vomiting    Decrease in BP levels   Sudafed [pseudoephedrine Hcl]    "Makes me Hyper"        Medication List        Accurate as of June 30, 2023 11:59 PM. If you have any questions, ask your nurse or doctor.          STOP taking these medications    simvastatin 40 MG tablet Commonly known as: ZOCOR Stopped by: Lucretia Roers       TAKE these medications    aspirin EC 81 MG tablet Take 81 mg by mouth daily.   atorvastatin 40 MG tablet Commonly known as: LIPITOR Take 1 tablet by mouth daily.   benazepril 20 MG tablet Commonly known as: LOTENSIN Take 20 mg by mouth daily.   levocetirizine 5 MG tablet Commonly known as: XYZAL Take 5 mg by mouth every evening.   multivitamin with minerals Tabs tablet Take 1 tablet by mouth daily.   omeprazole 40 MG capsule Commonly known as: PRILOSEC Take 40 mg by mouth daily.   tamsulosin 0.4 MG Caps capsule Commonly known as: Flomax Take 1 capsule (0.4 mg total) by mouth daily after supper. Started by: Lucretia Roers   triamcinolone ointment 0.1 % Commonly known as: KENALOG Apply 1 Application topically  2 (two) times daily. Apply sparingly to affected areas         ROS:  A comprehensive review of systems was negative except for: Gastrointestinal: positive for right groin pain Genitourinary: positive for increased frequency at night  Blood pressure (!) 158/85, pulse (!) 57, temperature 97.6 F (36.4 C), temperature source Oral, resp. rate 14, height 5\' 11"  (1.803 m), weight 169 lb (76.7 kg), SpO2 96%. Physical Exam Vitals reviewed.  HENT:     Head: Normocephalic.     Nose: Nose normal.     Mouth/Throat:     Mouth: Mucous membranes are moist.  Eyes:     Extraocular Movements: Extraocular movements intact.  Cardiovascular:     Rate and Rhythm: Normal rate and regular rhythm.  Pulmonary:     Effort: Pulmonary effort is normal.     Breath sounds: Normal breath sounds.  Abdominal:     General: There is no  distension.     Palpations: Abdomen is soft.     Tenderness: There is no abdominal tenderness.     Hernia: A hernia is present. Hernia is present in the right inguinal area. There is no hernia in the left inguinal area.     Comments: Reducible right inguinal hernia  Musculoskeletal:        General: Normal range of motion.  Skin:    General: Skin is warm.  Neurological:     General: No focal deficit present.     Mental Status: Randy Maddox is alert and oriented to person, place, and time.  Psychiatric:        Mood and Affect: Mood normal.        Behavior: Behavior normal.        Thought Content: Thought content normal.        Judgment: Judgment normal.     Results: None   Assessment & Plan:  Randy Maddox is a 82 y.o. male with a right inguinal hernia that is symptomatic.   Discussed the risk and benefits including, bleeding, infection, use of mesh, risk of recurrence, risk of nerve damage causing numbness or changes in sensation, risk of damage to the cord structures. The patient understands the risk and benefits of repair with mesh, and has decided to proceed.  We also discussed open versus robotic assisted laparoscopic surgery and the use of mesh. We discussed that I do both robotic and open repairs with mesh, and that these are considered equivalent. We discussed reasons for opting for laparoscopic surgery including if a bilateral repair is needed or if a patient has a recurrence after an open repair. We discussed the option of watch and wait in men and discussed that in 5 years some studies report that 40% of men have crossed over to needing a hernia repair because the hernia has become larger or symptomatic. We discussed that women are not appropriate candidate for watchful waiting due to the risk of femoral hernias.   Plan for robotic repair with mesh.  Will start flomax given the risk of retention post op and discussed that Randy Maddox may have to go home with a catheter if Randy Maddox retains.   All  questions were answered to the satisfaction of the patient and family.   Lucretia Roers 07/01/2023, 8:01 AM

## 2023-07-12 NOTE — Patient Instructions (Signed)
Randy Maddox  07/12/2023     @PREFPERIOPPHARMACY @   Your procedure is scheduled on  07/14/2023.   Report to [facility] at  1000  A.M.   Call this number if you have problems the morning of surgery:  [phone]  If you experience any cold or flu symptoms such as cough, fever, chills, shortness of breath, etc. between now and your scheduled surgery, please notify us at the above number.   Remember:  Do not eat after midnight.    You may drink clear liquids until 0800 am on 07/14/2023.    Clear liquids allowed are:                    Water, Juice (No red color; non-citric and without pulp; diabetics please choose diet or no sugar options), Carbonated beverages (diabetics please choose diet or no sugar options), Clear Tea (No creamer, milk, or cream, including half & half and powdered creamer), Black Coffee Only (No creamer, milk or cream, including half & half and powdered creamer), and Clear Sports drink (No red color; diabetics please choose diet or no sugar options)    Take these medicines the morning of surgery with A SIP OF WATER                                        omeprazole.    Do not wear jewelry, make-up or nail polish, including gel polish,  artificial nails, or any other type of covering on natural nails (fingers and  toes).  Do not wear lotions, powders, or perfumes, or deodorant.  Do not shave 48 hours prior to surgery.  Men may shave face and neck.  Do not bring valuables to the hospital.  [facility] is not responsible for any belongings or valuables.  Contacts, dentures or bridgework may not be worn into surgery.  Leave your suitcase in the car.  After surgery it may be brought to your room.  For patients admitted to the hospital, discharge time will be determined by your treatment team.  Patients discharged the day of surgery will not be allowed to drive home and must have someone with them for 24 hours.    Special instructions:   DO NOT smoke  tobacco or vape for 24 hours before your procedure.  Please read over the following fact sheets that you were given. Anesthesia Post-op Instructions and Care and Recovery After Surgery        Laparoscopic Inguinal Hernia Repair, Adult, Care After The following information offers guidance on how to care for yourself after your procedure. Your health care provider may also give you more specific instructions. If you have problems or questions, contact your health care provider. What can I expect after the procedure? After the procedure, it is common to have: Pain. Swelling and bruising around the incision area. Scrotal swelling, in males. Some fluid or blood draining from your incisions. Follow these instructions at home: Medicines Take over-the-counter and prescription medicines only as told by your health care provider. Ask your health care provider if the medicine prescribed to you: Requires you to avoid driving or using machinery. Can cause constipation. You may need to take these actions to prevent or treat constipation: Drink enough fluid to keep your urine pale yellow. Take over-the-counter or prescription medicines. Eat foods that are high in fiber, such as  beans, whole grains, and fresh fruits and vegetables. Limit foods that are high in fat and processed sugars, such as fried or sweet foods. Incision care  Follow instructions from your health care provider about how to take care of your incisions. Make sure you: Wash your hands with soap and water for at least 20 seconds before and after you change your bandage (dressing). If soap and water are not available, use hand sanitizer. Change your dressing as told by your health care provider. Leave stitches (sutures), skin glue, or adhesive strips in place. These skin closures may need to stay in place for 2 weeks or longer. If adhesive strip edges start to loosen and curl up, you may trim the loose edges. Do not remove adhesive  strips completely unless your health care provider tells you to do that. Check your incision area every day for signs of infection. Check for: More redness, swelling, or pain. More fluid or blood. Warmth. Pus or a bad smell. Wear loose, soft clothing while your incisions heal. Managing pain and swelling If directed, put ice on the painful or swollen areas. To do this: Put ice in a plastic bag. Place a towel between your skin and the bag. Leave the ice on for 20 minutes, 2-3 times a day. Remove the ice if your skin turns bright red. This is very important. If you cannot feel pain, heat, or cold, you have a greater risk of damage to the area.  Activity Do not lift anything that is heavier than 10 lb (4.5 kg), or the limit that you are told, until your health care provider says that it is safe. Ask your health care provider what activities are safe for you. A lot of activity during the first week after surgery can increase pain and swelling. For 1 week after your procedure: Avoid activities that take a lot of effort, such as exercise or sports. You may walk and climb stairs as needed for daily activity, but avoid long walks or climbing stairs for exercise. General instructions If you were given a sedative during the procedure, it can affect you for several hours. Do not drive or operate machinery until your health care provider says that it is safe. Do not take baths, swim, or use a hot tub until your health care provider approves. Ask your health care provider if you may take showers. You may only be allowed to take sponge baths. Do not use any products that contain nicotine or tobacco. These products include cigarettes, chewing tobacco, and vaping devices, such as e-cigarettes. If you need help quitting, ask your health care provider. Keep all follow-up visits. This is important. Contact a health care provider if: You have any of these signs of infection: More redness, swelling, or pain  around your incisions or your groin area. More fluid or blood coming from an incision. Warmth coming from an incision. Pus or a bad smell coming from an incision. A fever or chills. You have more swelling in your scrotum, if you are male. You have severe pain and medicines do not help. You have abdominal pain or swelling. You cannot urinate or have a bowel movement. You faint or feel dizzy. You have nausea and vomiting. Get help right away if: You have redness, warmth, or pain in your leg. You have chest pain. You have problems breathing. These symptoms may represent a serious problem that is an emergency. Do not wait to see if the symptoms will go away. Get medical help right  away. Call your local emergency services (911 in the [place] Do not drive yourself to the hospital. Summary Pain, swelling, and bruising are common after the procedure. Check your incision area every day for signs of infection, such as more redness, swelling, or pain. Put ice on painful or swollen areas for 20 minutes, 2-3 times a day. This information is not intended to replace advice given to you by your health care provider. Make sure you discuss any questions you have with your health care provider. Document Revised: 03/25/2020 Document Reviewed: 03/25/2020 Elsevier Patient Education  2024 [employer] General Anesthesia, Adult, Care After The following information offers guidance on how to care for yourself after your procedure. Your health care provider may also give you more specific instructions. If you have problems or questions, contact your health care provider. What can I expect after the procedure? After the procedure, it is common for people to: Have pain or discomfort at the IV site. Have nausea or vomiting. Have a sore throat or hoarseness. Have trouble concentrating. Feel cold or chills. Feel weak, sleepy, or tired (fatigue). Have soreness and body aches. These can affect parts of the body  that were not involved in surgery. Follow these instructions at home: For the time period you were told by your health care provider:  Rest. Do not participate in activities where you could fall or become injured. Do not drive or use machinery. Do not drink alcohol. Do not take sleeping pills or medicines that cause drowsiness. Do not make important decisions or sign legal documents. Do not take care of children on your own. General instructions Drink enough fluid to keep your urine pale yellow. If you have sleep apnea, surgery and certain medicines can increase your risk for breathing problems. Follow instructions from your health care provider about wearing your sleep device: Anytime you are sleeping, including during daytime naps. While taking prescription pain medicines, sleeping medicines, or medicines that make you drowsy. Return to your normal activities as told by your health care provider. Ask your health care provider what activities are safe for you. Take over-the-counter and prescription medicines only as told by your health care provider. Do not use any products that contain nicotine or tobacco. These products include cigarettes, chewing tobacco, and vaping devices, such as e-cigarettes. These can delay incision healing after surgery. If you need help quitting, ask your health care provider. Contact a health care provider if: You have nausea or vomiting that does not get better with medicine. You vomit every time you eat or drink. You have pain that does not get better with medicine. You cannot urinate or have bloody urine. You develop a skin rash. You have a fever. Get help right away if: You have trouble breathing. You have chest pain. You vomit blood. These symptoms may be an emergency. Get help right away. Call 911. Do not wait to see if the symptoms will go away. Do not drive yourself to the hospital. Summary After the procedure, it is common to have a sore throat,  hoarseness, nausea, vomiting, or to feel weak, sleepy, or fatigue. For the time period you were told by your health care provider, do not drive or use machinery. Get help right away if you have difficulty breathing, have chest pain, or vomit blood. These symptoms may be an emergency. This information is not intended to replace advice given to you by your health care provider. Make sure you discuss any questions you have with your health care provider.  Document Revised: 10/23/2021 Document Reviewed: 10/23/2021 Elsevier Patient Education  2024 [employer] How to Use Chlorhexidine Before Surgery Chlorhexidine gluconate (CHG) is a germ-killing (antiseptic) solution that is used to clean the skin. It can get rid of the bacteria that normally live on the skin and can keep them away for about 24 hours. To clean your skin with CHG, you may be given: A CHG solution to use in the shower or as part of a sponge bath. A prepackaged cloth that contains CHG. Cleaning your skin with CHG may help lower the risk for infection: While you are staying in the intensive care unit of the hospital. If you have a vascular access, such as a central line, to provide short-term or long-term access to your veins. If you have a catheter to drain urine from your bladder. If you are on a ventilator. A ventilator is a machine that helps you breathe by moving air in and out of your lungs. After surgery. What are the risks? Risks of using CHG include: A skin reaction. Hearing loss, if CHG gets in your ears and you have a perforated eardrum. Eye injury, if CHG gets in your eyes and is not rinsed out. The CHG product catching fire. Make sure that you avoid smoking and flames after applying CHG to your skin. Do not use CHG: If you have a chlorhexidine allergy or have previously reacted to chlorhexidine. On babies younger than 32 months of age. How to use CHG solution Use CHG only as told by your health care provider, and  follow the instructions on the label. Use the full amount of CHG as directed. Usually, this is one bottle. During a shower Follow these steps when using CHG solution during a shower (unless your health care provider gives you different instructions): Start the shower. Use your normal soap and shampoo to wash your face and hair. Turn off the shower or move out of the shower stream. Pour the CHG onto a clean washcloth. Do not use any type of brush or rough-edged sponge. Starting at your neck, lather your body down to your toes. Make sure you follow these instructions: If you will be having surgery, pay special attention to the part of your body where you will be having surgery. Scrub this area for at least 1 minute. Do not use CHG on your head or face. If the solution gets into your ears or eyes, rinse them well with water. Avoid your genital area. Avoid any areas of skin that have broken skin, cuts, or scrapes. Scrub your back and under your arms. Make sure to wash skin folds. Let the lather sit on your skin for 1-2 minutes or as long as told by your health care provider. Thoroughly rinse your entire body in the shower. Make sure that all body creases and crevices are rinsed well. Dry off with a clean towel. Do not put any substances on your body afterward--such as powder, lotion, or perfume--unless you are told to do so by your health care provider. Only use lotions that are recommended by the manufacturer. Put on clean clothes or pajamas. If it is the night before your surgery, sleep in clean sheets.  During a sponge bath Follow these steps when using CHG solution during a sponge bath (unless your health care provider gives you different instructions): Use your normal soap and shampoo to wash your face and hair. Pour the CHG onto a clean washcloth. Starting at your neck, lather your body down to your  toes. Make sure you follow these instructions: If you will be having surgery, pay special  attention to the part of your body where you will be having surgery. Scrub this area for at least 1 minute. Do not use CHG on your head or face. If the solution gets into your ears or eyes, rinse them well with water. Avoid your genital area. Avoid any areas of skin that have broken skin, cuts, or scrapes. Scrub your back and under your arms. Make sure to wash skin folds. Let the lather sit on your skin for 1-2 minutes or as long as told by your health care provider. Using a different clean, wet washcloth, thoroughly rinse your entire body. Make sure that all body creases and crevices are rinsed well. Dry off with a clean towel. Do not put any substances on your body afterward--such as powder, lotion, or perfume--unless you are told to do so by your health care provider. Only use lotions that are recommended by the manufacturer. Put on clean clothes or pajamas. If it is the night before your surgery, sleep in clean sheets. How to use CHG prepackaged cloths Only use CHG cloths as told by your health care provider, and follow the instructions on the label. Use the CHG cloth on clean, dry skin. Do not use the CHG cloth on your head or face unless your health care provider tells you to. When washing with the CHG cloth: Avoid your genital area. Avoid any areas of skin that have broken skin, cuts, or scrapes. Before surgery Follow these steps when using a CHG cloth to clean before surgery (unless your health care provider gives you different instructions): Using the CHG cloth, vigorously scrub the part of your body where you will be having surgery. Scrub using a back-and-forth motion for 3 minutes. The area on your body should be completely wet with CHG when you are done scrubbing. Do not rinse. Discard the cloth and let the area air-dry. Do not put any substances on the area afterward, such as powder, lotion, or perfume. Put on clean clothes or pajamas. If it is the night before your surgery, sleep  in clean sheets.  For general bathing Follow these steps when using CHG cloths for general bathing (unless your health care provider gives you different instructions). Use a separate CHG cloth for each area of your body. Make sure you wash between any folds of skin and between your fingers and toes. Wash your body in the following order, switching to a new cloth after each step: The front of your neck, shoulders, and chest. Both of your arms, under your arms, and your hands. Your stomach and groin area, avoiding the genitals. Your right leg and foot. Your left leg and foot. The back of your neck, your back, and your buttocks. Do not rinse. Discard the cloth and let the area air-dry. Do not put any substances on your body afterward--such as powder, lotion, or perfume--unless you are told to do so by your health care provider. Only use lotions that are recommended by the manufacturer. Put on clean clothes or pajamas. Contact a health care provider if: Your skin gets irritated after scrubbing. You have questions about using your solution or cloth. You swallow any chlorhexidine. Call your local poison control center ([phone] in the [place] Get help right away if: Your eyes itch badly, or they become very red or swollen. Your skin itches badly and is red or swollen. Your hearing changes. You have trouble seeing. You  have swelling or tingling in your mouth or throat. You have trouble breathing. These symptoms may represent a serious problem that is an emergency. Do not wait to see if the symptoms will go away. Get medical help right away. Call your local emergency services (911 in the [place] Do not drive yourself to the hospital. Summary Chlorhexidine gluconate (CHG) is a germ-killing (antiseptic) solution that is used to clean the skin. Cleaning your skin with CHG may help to lower your risk for infection. You may be given CHG to use for bathing. It may be in a bottle or in a prepackaged  cloth to use on your skin. Carefully follow your health care provider's instructions and the instructions on the product label. Do not use CHG if you have a chlorhexidine allergy. Contact your health care provider if your skin gets irritated after scrubbing. This information is not intended to replace advice given to you by your health care provider. Make sure you discuss any questions you have with your health care provider. Document Revised: 11/23/2021 Document Reviewed: 10/06/2020 Elsevier Patient Education  2023 [employer].

## 2023-07-13 ENCOUNTER — Encounter (HOSPITAL_COMMUNITY)
Admission: RE | Admit: 2023-07-13 | Discharge: 2023-07-13 | Disposition: A | Discharge status: Home / Self Care | Payer: Medicare Other | Source: Ambulatory Visit | Attending: General Surgery | Admitting: General Surgery

## 2023-07-13 ENCOUNTER — Encounter (HOSPITAL_COMMUNITY): Payer: Self-pay

## 2023-07-13 VITALS — BP 158/85 | HR 57 | Temp 97.6°F | Resp 18 | Ht 71.0 in | Wt 169.0 lb

## 2023-07-13 DIAGNOSIS — Z01810 Encounter for preprocedural cardiovascular examination: Secondary | ICD-10-CM | POA: Diagnosis not present

## 2023-07-13 DIAGNOSIS — I10 Essential (primary) hypertension: Secondary | ICD-10-CM | POA: Diagnosis not present

## 2023-07-13 DIAGNOSIS — Z01818 Encounter for other preprocedural examination: Secondary | ICD-10-CM | POA: Diagnosis present

## 2023-07-13 HISTORY — DX: Other specified postprocedural states: Z98.890

## 2023-07-14 ENCOUNTER — Ambulatory Visit (HOSPITAL_COMMUNITY)
Admission: RE | Admit: 2023-07-14 | Discharge: 2023-07-14 | Disposition: A | Discharge status: Home / Self Care | Payer: Medicare Other | Attending: General Surgery | Admitting: General Surgery

## 2023-07-14 ENCOUNTER — Encounter (HOSPITAL_COMMUNITY): Payer: Self-pay | Admitting: General Surgery

## 2023-07-14 ENCOUNTER — Encounter (HOSPITAL_COMMUNITY)
Admission: RE | Disposition: A | Discharge status: Home / Self Care | Payer: Self-pay | Source: Home / Self Care | Attending: General Surgery

## 2023-07-14 ENCOUNTER — Ambulatory Visit (HOSPITAL_COMMUNITY): Payer: Medicare Other | Admitting: Anesthesiology

## 2023-07-14 DIAGNOSIS — K4090 Unilateral inguinal hernia, without obstruction or gangrene, not specified as recurrent: Secondary | ICD-10-CM | POA: Insufficient documentation

## 2023-07-14 DIAGNOSIS — I10 Essential (primary) hypertension: Secondary | ICD-10-CM | POA: Insufficient documentation

## 2023-07-14 DIAGNOSIS — K219 Gastro-esophageal reflux disease without esophagitis: Secondary | ICD-10-CM | POA: Diagnosis not present

## 2023-07-14 HISTORY — PX: XI ROBOTIC ASSISTED INGUINAL HERNIA REPAIR WITH MESH: SHX6706

## 2023-07-14 SURGERY — REPAIR, HERNIA, INGUINAL, ROBOT-ASSISTED, LAPAROSCOPIC, USING MESH
Anesthesia: General | Site: Abdomen | Laterality: Right

## 2023-07-14 MED ORDER — SUGAMMADEX SODIUM 200 MG/2ML IV SOLN
INTRAVENOUS | Status: DC | PRN
  Administered 2023-07-14: 400 mg via INTRAVENOUS

## 2023-07-14 MED ORDER — CEFAZOLIN SODIUM-DEXTROSE 2-4 GM/100ML-% IV SOLN
INTRAVENOUS | Status: AC
  Filled 2023-07-14: qty 100

## 2023-07-14 MED ORDER — ONDANSETRON HCL 4 MG/2ML IJ SOLN
INTRAMUSCULAR | Status: AC
Start: 2023-07-14 — End: ?
  Filled 2023-07-14: qty 2

## 2023-07-14 MED ORDER — FENTANYL CITRATE (PF) 100 MCG/2ML IJ SOLN
INTRAMUSCULAR | Status: AC
  Filled 2023-07-14: qty 2

## 2023-07-14 MED ORDER — EPHEDRINE SULFATE-NACL 50-0.9 MG/10ML-% IV SOSY
PREFILLED_SYRINGE | INTRAVENOUS | Status: DC | PRN
  Administered 2023-07-14: 5 mg via INTRAVENOUS
  Administered 2023-07-14: 10 mg via INTRAVENOUS

## 2023-07-14 MED ORDER — FENTANYL CITRATE (PF) 100 MCG/2ML IJ SOLN
INTRAMUSCULAR | Status: DC | PRN
Start: 2023-07-14 — End: 2023-07-14
  Administered 2023-07-14 (×4): 50 ug via INTRAVENOUS

## 2023-07-14 MED ORDER — ONDANSETRON HCL 4 MG/2ML IJ SOLN: 4.0000 mg | Freq: Once | INTRAMUSCULAR | Status: DC | PRN

## 2023-07-14 MED ORDER — CHLORHEXIDINE GLUCONATE CLOTH 2 % EX PADS: 6.0000 | MEDICATED_PAD | Freq: Once | CUTANEOUS | Status: DC

## 2023-07-14 MED ORDER — STERILE WATER FOR IRRIGATION IR SOLN
Status: DC | PRN
  Administered 2023-07-14: 1000 mL

## 2023-07-14 MED ORDER — LACTATED RINGERS IV SOLN
INTRAVENOUS | Status: DC
Start: 2023-07-14 — End: 2023-07-14

## 2023-07-14 MED ORDER — PHENYLEPHRINE 80 MCG/ML (10ML) SYRINGE FOR IV PUSH (FOR BLOOD PRESSURE SUPPORT)
PREFILLED_SYRINGE | INTRAVENOUS | Status: AC
  Filled 2023-07-14: qty 10

## 2023-07-14 MED ORDER — PROPOFOL 10 MG/ML IV BOLUS
INTRAVENOUS | Status: DC | PRN
  Administered 2023-07-14: 120 mg via INTRAVENOUS

## 2023-07-14 MED ORDER — ONDANSETRON HCL 4 MG/2ML IJ SOLN
INTRAMUSCULAR | Status: DC | PRN
  Administered 2023-07-14: 4 mg via INTRAVENOUS

## 2023-07-14 MED ORDER — ONDANSETRON HCL 4 MG/2ML IJ SOLN
INTRAMUSCULAR | Status: AC
  Filled 2023-07-14: qty 2

## 2023-07-14 MED ORDER — ROCURONIUM BROMIDE 10 MG/ML (PF) SYRINGE
PREFILLED_SYRINGE | INTRAVENOUS | Status: AC
  Filled 2023-07-14: qty 10

## 2023-07-14 MED ORDER — DEXAMETHASONE SODIUM PHOSPHATE 10 MG/ML IJ SOLN
INTRAMUSCULAR | Status: AC
  Filled 2023-07-14: qty 1

## 2023-07-14 MED ORDER — BUPIVACAINE HCL (PF) 0.5 % IJ SOLN
INTRAMUSCULAR | Status: AC
  Filled 2023-07-14: qty 30

## 2023-07-14 MED ORDER — BUPIVACAINE HCL (PF) 0.5 % IJ SOLN
INTRAMUSCULAR | Status: DC | PRN
  Administered 2023-07-14: 30 mL

## 2023-07-14 MED ORDER — FENTANYL CITRATE PF 50 MCG/ML IJ SOSY: 25.0000 ug | PREFILLED_SYRINGE | INTRAMUSCULAR | Status: DC | PRN

## 2023-07-14 MED ORDER — LACTATED RINGERS IV SOLN: INTRAVENOUS | Status: DC | PRN

## 2023-07-14 MED ORDER — EPHEDRINE 5 MG/ML INJ
INTRAVENOUS | Status: AC
  Filled 2023-07-14: qty 5

## 2023-07-14 MED ORDER — PHENYLEPHRINE 80 MCG/ML (10ML) SYRINGE FOR IV PUSH (FOR BLOOD PRESSURE SUPPORT)
PREFILLED_SYRINGE | INTRAVENOUS | Status: DC | PRN
  Administered 2023-07-14: 80 ug via INTRAVENOUS
  Administered 2023-07-14: 160 ug via INTRAVENOUS

## 2023-07-14 MED ORDER — ONDANSETRON HCL 4 MG PO TABS: 4.0000 mg | ORAL_TABLET | Freq: Three times a day (TID) | ORAL | 1 refills | Status: AC | PRN

## 2023-07-14 MED ORDER — CHLORHEXIDINE GLUCONATE 0.12 % MT SOLN
OROMUCOSAL | Status: AC
  Filled 2023-07-14: qty 15

## 2023-07-14 MED ORDER — LIDOCAINE HCL (CARDIAC) PF 100 MG/5ML IV SOSY
PREFILLED_SYRINGE | INTRAVENOUS | Status: DC | PRN
  Administered 2023-07-14: 60 mg via INTRAVENOUS

## 2023-07-14 MED ORDER — CEFAZOLIN SODIUM-DEXTROSE 2-4 GM/100ML-% IV SOLN
2.0000 g | INTRAVENOUS | Status: AC
  Administered 2023-07-14: 2 g via INTRAVENOUS

## 2023-07-14 MED ORDER — OXYCODONE HCL 5 MG/5ML PO SOLN: 5.0000 mg | Freq: Once | ORAL | Status: AC | PRN

## 2023-07-14 MED ORDER — TAMSULOSIN HCL 0.4 MG PO CAPS: 0.4000 mg | ORAL_CAPSULE | Freq: Every day | ORAL | Status: AC

## 2023-07-14 MED ORDER — OXYCODONE HCL 5 MG PO TABS: 5.0000 mg | ORAL_TABLET | ORAL | 0 refills | Status: AC | PRN

## 2023-07-14 MED ORDER — LIDOCAINE HCL (PF) 2 % IJ SOLN
INTRAMUSCULAR | Status: AC
  Filled 2023-07-14: qty 5

## 2023-07-14 MED ORDER — ROCURONIUM BROMIDE 10 MG/ML (PF) SYRINGE
PREFILLED_SYRINGE | INTRAVENOUS | Status: DC | PRN
  Administered 2023-07-14: 40 mg via INTRAVENOUS
  Administered 2023-07-14: 60 mg via INTRAVENOUS

## 2023-07-14 MED ORDER — OXYCODONE HCL 5 MG PO TABS
5.0000 mg | ORAL_TABLET | Freq: Once | ORAL | Status: AC | PRN
  Administered 2023-07-14: 5 mg via ORAL
  Filled 2023-07-14: qty 1

## 2023-07-14 MED ORDER — HYDROMORPHONE HCL 1 MG/ML IJ SOLN
INTRAMUSCULAR | Status: AC
  Filled 2023-07-14: qty 1

## 2023-07-14 MED ORDER — DEXAMETHASONE SODIUM PHOSPHATE 10 MG/ML IJ SOLN
INTRAMUSCULAR | Status: DC | PRN
  Administered 2023-07-14: 8 mg via INTRAVENOUS

## 2023-07-14 SURGICAL SUPPLY — 49 items
CHLORAPREP W/TINT 26 (MISCELLANEOUS) ×1 IMPLANT
COVER LIGHT HANDLE STERIS (MISCELLANEOUS) ×1 IMPLANT
COVER MAYO STAND XLG (MISCELLANEOUS) ×1 IMPLANT
COVER TIP SHEARS 8 DVNC (MISCELLANEOUS) ×1 IMPLANT
DERMABOND ADVANCED .7 DNX12 (GAUZE/BANDAGES/DRESSINGS) ×1 IMPLANT
DRAPE 3/4 80X56 (DRAPES) ×1 IMPLANT
DRAPE ARM DVNC X/XI (DISPOSABLE) ×3 IMPLANT
DRAPE COLUMN DVNC XI (DISPOSABLE) ×1 IMPLANT
DRAPE HALF SHEET 40X57 (DRAPES) ×1 IMPLANT
DRIVER NDL MEGA SUTCUT DVNCXI (INSTRUMENTS) ×1 IMPLANT
DRIVER NDLE MEGA SUTCUT DVNCXI (INSTRUMENTS) ×1
ELECT REM PT RETURN 9FT ADLT (ELECTROSURGICAL) ×1
ELECTRODE REM PT RTRN 9FT ADLT (ELECTROSURGICAL) ×1 IMPLANT
FORCEPS BPLR R/ABLATION 8 DVNC (INSTRUMENTS) ×1 IMPLANT
GAUZE SPONGE 4X4 12PLY STRL (GAUZE/BANDAGES/DRESSINGS) ×1 IMPLANT
GLOVE BIO SURGEON STRL SZ 6.5 (GLOVE) ×2 IMPLANT
GLOVE BIOGEL PI IND STRL 6.5 (GLOVE) ×2 IMPLANT
GLOVE BIOGEL PI IND STRL 7.0 (GLOVE) ×3 IMPLANT
GOWN STRL REUS W/TWL LRG LVL3 (GOWN DISPOSABLE) ×2 IMPLANT
KIT PINK PAD W/HEAD ARE REST (MISCELLANEOUS) ×1
KIT PINK PAD W/HEAD ARM REST (MISCELLANEOUS) ×1 IMPLANT
KIT TURNOVER KIT A (KITS) ×1 IMPLANT
MANIFOLD NEPTUNE II (INSTRUMENTS) ×1 IMPLANT
MESH 3DMAX MID 5X7 RT XLRG (Mesh General) ×1 IMPLANT
NDL INSUFFLATION 14GA 120MM (NEEDLE) ×1 IMPLANT
NEEDLE HYPO 22GX1.5 SAFETY (NEEDLE) ×1 IMPLANT
NEEDLE INSUFFLATION 14GA 120MM (NEEDLE) ×1
OBTURATOR OPTICAL STND 8 DVNC (TROCAR) ×1
OBTURATOR OPTICALSTD 8 DVNC (TROCAR) ×1 IMPLANT
PACK LAP CHOLE LZT030E (CUSTOM PROCEDURE TRAY) ×1 IMPLANT
PENCIL HANDSWITCHING (ELECTRODE) ×1 IMPLANT
POSITIONER HEAD 8X9X4 ADT (SOFTGOODS) ×1 IMPLANT
SCISSORS MNPLR CVD DVNC XI (INSTRUMENTS) ×1 IMPLANT
SEAL UNIV 5-12 XI (MISCELLANEOUS) ×4 IMPLANT
SET TUBE SMOKE EVAC HIGH FLOW (TUBING) ×1 IMPLANT
SOL PREP POV-IOD 4OZ 10% (MISCELLANEOUS) ×1 IMPLANT
SUT MNCRL AB 4-0 PS2 18 (SUTURE) ×1 IMPLANT
SUT STRATA 3-0 SH (SUTURE) ×2 IMPLANT
SUT STRATAFIX 0 PDS+ CT-2 23 (SUTURE) ×1
SUT VIC AB 2-0 SH 27X BRD (SUTURE) ×1 IMPLANT
SUT VIC AB 3-0 SH 27X BRD (SUTURE) ×1 IMPLANT
SUT VICRYL 0 AB UR-6 (SUTURE) ×1 IMPLANT
SUTURE STRATFX 0 PDS+ CT-2 23 (SUTURE) IMPLANT
SYR 30ML LL (SYRINGE) ×2 IMPLANT
SYS RETRIEVAL 5MM INZII UNIV (BASKET) ×1
SYSTEM RETRIEVL 5MM INZII UNIV (BASKET) IMPLANT
TAPE TRANSPORE STRL 2 31045 (GAUZE/BANDAGES/DRESSINGS) ×1 IMPLANT
TRAY FOL W/BAG SLVR 16FR STRL (SET/KITS/TRAYS/PACK) ×1 IMPLANT
WATER STERILE IRR 500ML POUR (IV SOLUTION) ×1 IMPLANT

## 2023-07-14 NOTE — Transfer of Care (Signed)
Immediate Anesthesia Transfer of Care Note  Patient: Randy Maddox  Procedure(s) Performed: XI ROBOTIC ASSISTED INGUINAL HERNIA REPAIR WITH MESH (Right: Abdomen)  Patient Location: PACU  Anesthesia Type:General  Level of Consciousness: awake, alert , oriented, and patient cooperative  Airway & Oxygen Therapy: Patient Spontanous Breathing  Post-op Assessment: Report given to RN, Post -op Vital signs reviewed and stable, and Patient moving all extremities X 4  Post vital signs: Reviewed and stable  Last Vitals:  Vitals Value Taken Time  BP 151/80 07/14/23 1530  Temp 36.2 C 07/14/23 1530  Pulse 72 07/14/23 1532  Resp 27 07/14/23 1533  SpO2 98 % 07/14/23 1532  Vitals shown include unfiled device data.  Last Pain:  Vitals:   07/14/23 1115  TempSrc: Oral  PainSc: 0-No pain         Complications: No notable events documented.

## 2023-07-14 NOTE — Interval H&P Note (Signed)
History and Physical Interval Note:  07/14/2023 12:43 PM  Randy Maddox  has presented today for surgery, with the diagnosis of HERNIA, INGUINAL, RIGHT.  The various methods of treatment have been discussed with the patient and family. After consideration of risks, benefits and other options for treatment, the patient has consented to  Procedure(s): XI ROBOTIC ASSISTED INGUINAL HERNIA REPAIR WITH MESH (Right) as a surgical intervention.  The patient's history has been reviewed, patient examined, no change in status, stable for surgery.  I have reviewed the patient's chart and labs.  Questions were answered to the patient's satisfaction.     Lucretia Roers

## 2023-07-14 NOTE — Discharge Instructions (Addendum)
Discharge Robotic Assisted Laparoscopic Inguinal Hernia Surgery Instructions:  Common Complaints: Shoulder pain is common after laparoscopic surgery.  This is secondary to the gas used in the surgery being trapped under the diaphragm.  Walk to help your body absorb the gas. This will improve in a few days. Pain at the port sites are common, especially the larger port sites. This will improve with time.  Some nausea is common and poor appetite. The main goal is to stay hydrated the first few days after surgery.  You can ice the port sites and the groin as needed for the first 72 hours.  Bruising in the side of the repair and down into the groin can occur, if the bruising is spreading, call the office.   For Males: Some swelling and bruising down into the scrotum is normal, place a towel under your scrotum (folded into a square) to help with elevation of the scrotum and swelling. You may feel some residual gas used for the surgery in the scrotum. This will absorb with time.  Men will need to urinate in the post operative area prior to being released home. This is to prevent you from retaining urine in your bladder and needing a visit the emergency department later in the day/evening.   Diet/ Activity: Diet as tolerated. You may not have an appetite, but it is important to stay hydrated.  Drink 64 ounces of water a day. Your appetite will return with time.  Shower per your regular routine daily.  Do not take hot showers. Take warm showers that are less than 10 minutes as long may cause the glue to peel.  Rest and listen to your body, but do not remain in bed all day.  Walk everyday for at least 15-20 minutes. Deep cough and move around every 1-2 hours in the first few days after surgery.  Do not lift > 10 lbs, perform excessive bending, pushing, pulling, squatting for 4-6 weeks after surgery.  Do not pick at the dermabond glue on your incision sites.  This glue film will remain in place for 1-2  weeks and will start to peel off.  Do not place lotions or balms on your incision unless instructed to specifically by Dr. Henreitta Leber.   Pain Expectations and Narcotics: -After surgery you will have pain associated with your incisions and this is normal. The pain is muscular and nerve pain, and will get better with time. -You are encouraged and expected to take non narcotic medications like tylenol and ibuprofen (when able) to treat pain as multiple modalities can aid with pain treatment. -Narcotics are only used when pain is severe or there is breakthrough pain. -You are not expected to have a pain score of 0 after surgery, as we cannot prevent pain. A pain score of 3-4 that allows you to be functional, move, walk, and tolerate some activity is the goal. The pain will continue to improve over the days after surgery and is dependent on your surgery. -Due to [place] law, we are only able to give a certain amount of pain medication to treat post operative pain, and we only give additional narcotics on a patient by patient basis.  -For most laparoscopic surgery, studies have shown that the majority of patients only need 10-15 narcotic pills, and for open surgeries most patients only need 15-20.   -Having appropriate expectations of pain and knowledge of pain management with non narcotics is important as we do not want anyone to become addicted to narcotic  pain medication.  -Using ice packs in the first 48 hours and heating pads after 48 hours, wearing an abdominal binder (when recommended), and using over the counter medications are all ways to help with pain management.   -Simple acts like meditation and mindfulness practices after surgery can also help with pain control and research has proven the benefit of these practices.  Medication: Take tylenol and ibuprofen as needed for pain control, alternating every 4-6 hours.  Example:  Tylenol 1000mg  @ 6am, 12noon, 6pm, (Do not exceed 4000mg  of tylenol  a day). Ibuprofen 800mg  @ 9am, 3pm, 9pm, 3am (Do not exceed 3600mg  of ibuprofen a day).  Take Roxicodone for breakthrough pain every 4 hours.  Take Colace for constipation related to narcotic pain medication. If you do not have a bowel movement in 2 days, take Miralax over the counter.  Drink plenty of water to also prevent constipation.   Contact Information: If you have questions or concerns, please call our office, [phone], Monday- Thursday 8AM-5PM and Friday 8AM-12Noon.  If it is after hours or on the weekend, please call [facility], [phone], [phone], and ask to speak to the surgeon on call for Dr. Henreitta Leber at [facility].     Post Anesthesia Home Care Instructions  Activity: Get plenty of rest for the remainder of the day. A responsible individual must stay with you for 24 hours following the procedure.  For the next 24 hours, DO NOT: -Drive a car -Advertising copywriter -Drink alcoholic beverages -Take any medication unless instructed by your physician -Make any legal decisions or sign important papers.  Meals: Start with liquid foods such as gelatin or soup. Progress to regular foods as tolerated. Avoid greasy, spicy, heavy foods. If nausea and/or vomiting occur, drink only clear liquids until the nausea and/or vomiting subsides. Call your physician if vomiting continues.  Special Instructions/Symptoms: Your throat may feel dry or sore from the anesthesia or the breathing tube placed in your throat during surgery. If this causes discomfort, gargle with warm salt water. The discomfort should disappear within 24 hours.  If you had a scopolamine patch placed behind your ear for the management of post- operative nausea and/or vomiting:  1. The medication in the patch is effective for 72 hours, after which it should be removed.  Wrap patch in a tissue and discard in the trash. Wash hands thoroughly with soap and water. 2. You may remove the patch earlier than 72 hours  if you experience unpleasant side effects which may include dry mouth, dizziness or visual disturbances. 3. Avoid touching the patch. Wash your hands with soap and water after contact with the patch.

## 2023-07-14 NOTE — Op Note (Signed)
[facility] Operative Note  07/14/23  Preoperative Diagnosis: Right inguinal Hernia   Postoperative Diagnosis: Right Direct and Indirect hernia    Procedure(s) Performed: Robotic assisted laparoscopic right inguinal hernia repair with mesh   Surgeon: Leatrice Jewels. Henreitta Leber, MD   Assistants: No qualified resident was available    Anesthesia: General endotracheal   Anesthesiologist: Dr. Johnnette Litter, MD    Specimens: None   Estimated Blood Loss: Minimal   Blood Replacement: None    Complications: None   Wound Class:Clean   Operative Indications: The patient has a right inguinal hernia that is symptomatic and they want repaired. We discussed robotic assisted laparoscopic inguinal hernia repair and risk of bleeding, infection, issues with chronic pain post operatively, use of mesh, risk of recurrence, chance of needing to repair a bilateral hernia, risk of injury to bowel or bladder, and risk of injury to cord structures for male patients.   Findings: Vas Deferens and cord structures identified and preserved Bard 3D Max Medium Weight Mesh, XL size Hemostasis achieved   Procedure: The patient was taken to the operating room and placed supine. General endotracheal anesthesia was induced. Intravenous antibiotics were  administered per protocol.  A foley catheter was placed and a orogastric tube positioned to decompress the stomach. The abdomen and groin were prepared and draped in the usual sterile fashion.   An incision was marked 20 cm above the pubic tubercle, slightly above the umbilicus. Veress needle inserted at palmer's point.  Saline drop test noted to be positive with gradual increase in pressure after initiation of gas insufflation.  15 mm of pressure was achieved prior to removing the Veress needle and then placing a 8 mm port via the Optiview technique through the supraumbilical site that had been previously marked.  Inspection of the area afterwards noted no injury  to the surrounding organs during insertion of the needle and the port.  2 port sites were marked 8 cm to the lateral sides of the initial port, and a 8 mm robotic port was placed on the left side, another 8 mm robotic port on the right side under direct supervision. The [employer] platform was then brought into the operative field and docked to the ports.  Examination of the abdominal cavity noted a right inguinal hernia.  A peritoneal flap was created approximately 6cm cephalad to the defect by using scissors with electrocautery.  Dissection was carried down towards the pubic tubercle, developing the myopectineal orifice view. Laterally the flap was carried towards the ASIS.  A direct and indirect hernia sac were noted, which carefully dissected away from the adjacent tissues to be fully reduced out of hernia cavity.  A cord lipoma was reduced and removed. Any bleeding was controlled with combination of electrocautery and manual pressure.    After confirming adequate dissection and the peritoneal reflection completely down and away from the cord structures the deep ring, a 0 Stratafix PDS was used to close the direct defect to prevent seroma formation. Then a XL Bard 3DMax Medium weight mesh was placed within the anterior abdominal wall and secured in place using 2-0 Vicryl immediately above the pubic tubercle and superior on the abdominal wall ensuring no vessels or nerves were incorporated.  After noting proper placement of the mesh with the peritoneal reflection deep to it, the previously created peritoneal flap was secured back up to the anterior abdominal wall using running 3-0 Stratafix Monocryl. Any holes created in the peritoneal flap were closed with cautery. A  cord lipoma that had been removed was placed in an Endocatch bag and removed. All needles were then removed out of the abdominal cavity, Xi platform undocked from the ports and removed off of operative field.  Marcaine mixed with marcaine was  infused as ilioinguinal block and at the port sites.   The abdomen was then desufflated and ports removed. All skin incisions were closed with a subcuticular stitch of Monocryl 4-0. Dermabond was applied. The testis was gently pulled down into its anatomic position in the scrotum.  Final inspection revealed acceptable hemostasis. All counts were correct at the end of the case. The patient was awakened from anesthesia and extubated without complication.  The patient went to the PACU in stable condition.   Algis Greenhouse, MD [facility] [address] [place], [place] [address] [phone] (office)

## 2023-07-14 NOTE — Anesthesia Preprocedure Evaluation (Signed)
Anesthesia Evaluation  Patient identified by MRN, date of birth, ID band Patient awake    Reviewed: Allergy & Precautions, H&P , NPO status , Patient's Chart, lab work & pertinent test results, reviewed documented beta blocker date and time   History of Anesthesia Complications (+) PONV and history of anesthetic complications  Airway Mallampati: II  TM Distance: >3 FB Neck ROM: full    Dental no notable dental hx.    Pulmonary neg pulmonary ROS, pneumonia   Pulmonary exam normal breath sounds clear to auscultation       Cardiovascular Exercise Tolerance: Good hypertension, negative cardio ROS  Rhythm:regular Rate:Normal     Neuro/Psych negative neurological ROS  negative psych ROS   GI/Hepatic negative GI ROS, Neg liver ROS,GERD  ,,  Endo/Other  negative endocrine ROS    Renal/GU negative Renal ROS  negative genitourinary   Musculoskeletal   Abdominal   Peds  Hematology negative hematology ROS (+)   Anesthesia Other Findings   Reproductive/Obstetrics negative OB ROS                             Anesthesia Physical Anesthesia Plan  ASA: 2  Anesthesia Plan: General and General ETT   Post-op Pain Management:    Induction:   PONV Risk Score and Plan: Ondansetron  Airway Management Planned:   Additional Equipment:   Intra-op Plan:   Post-operative Plan:   Informed Consent: I have reviewed the patients History and Physical, chart, labs and discussed the procedure including the risks, benefits and alternatives for the proposed anesthesia with the patient or authorized representative who has indicated his/her understanding and acceptance.     Dental Advisory Given  Plan Discussed with: CRNA  Anesthesia Plan Comments:        Anesthesia Quick Evaluation

## 2023-07-14 NOTE — Interval H&P Note (Signed)
History and Physical Interval Note:  07/14/2023 1:01 PM  Randy Maddox  has presented today for surgery, with the diagnosis of HERNIA, INGUINAL, RIGHT.  The various methods of treatment have been discussed with the patient and family. After consideration of risks, benefits and other options for treatment, the patient has consented to  Procedure(s): XI ROBOTIC ASSISTED INGUINAL HERNIA REPAIR WITH MESH (Right) as a surgical intervention.  The patient's history has been reviewed, patient examined, no change in status, stable for surgery.  I have reviewed the patient's chart and labs.  Questions were answered to the patient's satisfaction.     Lucretia Roers

## 2023-07-14 NOTE — Anesthesia Procedure Notes (Signed)
Procedure Name: Intubation Date/Time: 07/14/2023 1:36 PM  Performed by: Oletha Cruel, CRNAPre-anesthesia Checklist: Patient identified, Emergency Drugs available, Suction available, Patient being monitored and Timeout performed Patient Re-evaluated:Patient Re-evaluated prior to induction Oxygen Delivery Method: Circle system utilized Preoxygenation: Pre-oxygenation with 100% oxygen Induction Type: IV induction Ventilation: Mask ventilation without difficulty Laryngoscope Size: Glidescope and 3 Grade View: Grade I Tube type: Oral Number of attempts: 1 Airway Equipment and Method: Stylet Placement Confirmation: ETT inserted through vocal cords under direct vision, positive ETCO2, CO2 detector and breath sounds checked- equal and bilateral Secured at: 22 cm Tube secured with: Tape Dental Injury: Teeth and Oropharynx as per pre-operative assessment  Comments: Patient intubated by Candise Bowens, EMT student. Patient preoxygenated.Easy bag/mask ventilation. Atraumatic intubation using Glidescope size 3. Lips and teeth remain in preoperative condition.

## 2023-07-14 NOTE — Progress Notes (Signed)
[facility]  Updated family. Will need to urinate before dc home. If getting late may need to stay overnight to ensure he has no issues with retention given so late in the day.  Algis Greenhouse, MD [facility] [address] [place] [place], [place] [address] [phone] (office)

## 2023-07-18 ENCOUNTER — Encounter (HOSPITAL_COMMUNITY): Payer: Self-pay | Admitting: General Surgery

## 2023-07-24 NOTE — Anesthesia Postprocedure Evaluation (Signed)
Anesthesia Post Note  Patient: Randy Maddox  Procedure(s) Performed: XI ROBOTIC ASSISTED INGUINAL HERNIA REPAIR WITH MESH (Right: Abdomen)  Patient location during evaluation: Phase II Anesthesia Type: General Level of consciousness: awake Pain management: pain level controlled Vital Signs Assessment: post-procedure vital signs reviewed and stable Respiratory status: spontaneous breathing and respiratory function stable Cardiovascular status: blood pressure returned to baseline and stable Postop Assessment: no headache and no apparent nausea or vomiting Anesthetic complications: no Comments: Late entry   No notable events documented.   Last Vitals:  Vitals:   07/14/23 1630 07/14/23 1642  BP: (!) 147/129 (!) 170/84  Pulse: 83 76  Resp: (!) 21 20  Temp: (!) 36.4 C (!) 36.4 C  SpO2: 98% 97%    Last Pain:  Vitals:   07/14/23 1642  TempSrc: Axillary  PainSc: 5                  Windell Norfolk

## 2023-08-16 ENCOUNTER — Ambulatory Visit (INDEPENDENT_AMBULATORY_CARE_PROVIDER_SITE_OTHER): Payer: Medicare Other | Admitting: General Surgery

## 2023-08-16 ENCOUNTER — Encounter: Payer: Self-pay | Admitting: General Surgery

## 2023-08-16 VITALS — BP 153/84 | HR 65 | Temp 97.5°F | Resp 14 | Ht 71.0 in | Wt 167.0 lb

## 2023-08-16 DIAGNOSIS — K4090 Unilateral inguinal hernia, without obstruction or gangrene, not specified as recurrent: Secondary | ICD-10-CM

## 2023-08-16 NOTE — Patient Instructions (Signed)
 Diet and activity as tolerated. Call the office if any issues.

## 2023-08-17 NOTE — Progress Notes (Signed)
 [facility]  Doing well after surgery. Never used any narcotics. He says he was just a little sore. He did have bruising that is resolved.   BP (!) 153/84   Pulse 65   Temp (!) 97.5 F (36.4 C) (Oral)   Resp 14   Ht 5' 11 (1.803 m)   Wt 167 lb (75.8 kg)   SpO2 97%   BMI 23.29 kg/m  Port sites healed Right groin without seroma or bulging   Patient s/p right robotic assisted laparoscopic inguinal hernia repair with mesh.   Diet and activity as tolerated. Call the office if any issues.   Manuelita Pander, MD [facility] [address] [place], [place] [address] [phone] (office)

## 2023-08-24 DIAGNOSIS — L2089 Other atopic dermatitis: Secondary | ICD-10-CM | POA: Diagnosis not present

## 2023-08-24 DIAGNOSIS — C44320 Squamous cell carcinoma of skin of unspecified parts of face: Secondary | ICD-10-CM | POA: Diagnosis not present

## 2023-12-15 DIAGNOSIS — R7303 Prediabetes: Secondary | ICD-10-CM | POA: Diagnosis not present

## 2023-12-15 DIAGNOSIS — I10 Essential (primary) hypertension: Secondary | ICD-10-CM | POA: Diagnosis not present

## 2023-12-15 DIAGNOSIS — E785 Hyperlipidemia, unspecified: Secondary | ICD-10-CM | POA: Diagnosis not present

## 2023-12-21 DIAGNOSIS — K219 Gastro-esophageal reflux disease without esophagitis: Secondary | ICD-10-CM | POA: Diagnosis not present

## 2023-12-21 DIAGNOSIS — N289 Disorder of kidney and ureter, unspecified: Secondary | ICD-10-CM | POA: Diagnosis not present

## 2023-12-21 DIAGNOSIS — D649 Anemia, unspecified: Secondary | ICD-10-CM | POA: Diagnosis not present

## 2023-12-21 DIAGNOSIS — J302 Other seasonal allergic rhinitis: Secondary | ICD-10-CM | POA: Diagnosis not present

## 2023-12-21 DIAGNOSIS — E785 Hyperlipidemia, unspecified: Secondary | ICD-10-CM | POA: Diagnosis not present

## 2023-12-21 DIAGNOSIS — K649 Unspecified hemorrhoids: Secondary | ICD-10-CM | POA: Diagnosis not present

## 2023-12-21 DIAGNOSIS — K469 Unspecified abdominal hernia without obstruction or gangrene: Secondary | ICD-10-CM | POA: Diagnosis not present

## 2023-12-21 DIAGNOSIS — I2510 Atherosclerotic heart disease of native coronary artery without angina pectoris: Secondary | ICD-10-CM | POA: Diagnosis not present

## 2023-12-21 DIAGNOSIS — R7303 Prediabetes: Secondary | ICD-10-CM | POA: Diagnosis not present

## 2023-12-21 DIAGNOSIS — L989 Disorder of the skin and subcutaneous tissue, unspecified: Secondary | ICD-10-CM | POA: Diagnosis not present

## 2023-12-21 DIAGNOSIS — D696 Thrombocytopenia, unspecified: Secondary | ICD-10-CM | POA: Diagnosis not present

## 2023-12-21 DIAGNOSIS — I10 Essential (primary) hypertension: Secondary | ICD-10-CM | POA: Diagnosis not present

## 2024-01-05 DIAGNOSIS — H524 Presbyopia: Secondary | ICD-10-CM | POA: Diagnosis not present

## 2024-01-05 DIAGNOSIS — H43813 Vitreous degeneration, bilateral: Secondary | ICD-10-CM | POA: Diagnosis not present

## 2024-01-05 DIAGNOSIS — H5212 Myopia, left eye: Secondary | ICD-10-CM | POA: Diagnosis not present

## 2024-01-05 DIAGNOSIS — H0100A Unspecified blepharitis right eye, upper and lower eyelids: Secondary | ICD-10-CM | POA: Diagnosis not present

## 2024-01-05 DIAGNOSIS — H04123 Dry eye syndrome of bilateral lacrimal glands: Secondary | ICD-10-CM | POA: Diagnosis not present

## 2024-01-05 DIAGNOSIS — H5000 Unspecified esotropia: Secondary | ICD-10-CM | POA: Diagnosis not present

## 2024-01-05 DIAGNOSIS — H0100B Unspecified blepharitis left eye, upper and lower eyelids: Secondary | ICD-10-CM | POA: Diagnosis not present

## 2024-01-05 DIAGNOSIS — H532 Diplopia: Secondary | ICD-10-CM | POA: Diagnosis not present

## 2024-01-05 DIAGNOSIS — H5201 Hypermetropia, right eye: Secondary | ICD-10-CM | POA: Diagnosis not present

## 2024-01-05 DIAGNOSIS — H52203 Unspecified astigmatism, bilateral: Secondary | ICD-10-CM | POA: Diagnosis not present
